# Patient Record
Sex: Male | Born: 1985 | Race: White | Hispanic: No | Marital: Single | Smoking: Never smoker
Health system: Southern US, Community
[De-identification: ages and names within clinical notes are randomized; demographics above are authoritative.]

## PROBLEM LIST (undated history)

## (undated) DIAGNOSIS — F1020 Alcohol dependence, uncomplicated: Secondary | ICD-10-CM

## (undated) DIAGNOSIS — F419 Anxiety disorder, unspecified: Secondary | ICD-10-CM

## (undated) DIAGNOSIS — F32A Depression, unspecified: Secondary | ICD-10-CM

## (undated) DIAGNOSIS — F1010 Alcohol abuse, uncomplicated: Secondary | ICD-10-CM

## (undated) HISTORY — PX: TONSILLECTOMY: SUR1361

---
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
## 2011-05-14 ENCOUNTER — Emergency Department (HOSPITAL_COMMUNITY)
Admission: EM | Admit: 2011-05-14 | Discharge: 2011-05-15 | Disposition: A | Discharge status: Other Acute Inpatient Hospital | Payer: BC Managed Care – PPO | Attending: Emergency Medicine | Admitting: Emergency Medicine

## 2011-05-14 DIAGNOSIS — F1010 Alcohol abuse, uncomplicated: Secondary | ICD-10-CM | POA: Insufficient documentation

## 2011-05-14 LAB — COMPREHENSIVE METABOLIC PANEL
Chloride: 99 mEq/L (ref 96–112)
Creatinine, Ser: 0.92 mg/dL (ref 0.50–1.35)
Glucose, Bld: 95 mg/dL (ref 70–99)
Potassium: 4.8 mEq/L (ref 3.5–5.1)

## 2011-05-14 LAB — DIFFERENTIAL
Basophils Absolute: 0.1 10*3/uL (ref 0.0–0.1)
Basophils Relative: 1 % (ref 0–1)
Eosinophils Absolute: 0.7 10*3/uL (ref 0.0–0.7)
Lymphocytes Relative: 24 % (ref 12–46)
Monocytes Relative: 15 % — ABNORMAL HIGH (ref 3–12)

## 2011-05-14 LAB — CBC
HCT: 42.9 % (ref 39.0–52.0)
Hemoglobin: 15.6 g/dL (ref 13.0–17.0)
MCV: 87 fL (ref 78.0–100.0)
Platelets: 199 10*3/uL (ref 150–400)

## 2011-05-14 LAB — HEPATIC FUNCTION PANEL
ALT: 132 U/L — ABNORMAL HIGH (ref 0–53)
Albumin: 4.4 g/dL (ref 3.5–5.2)
Indirect Bilirubin: 0.7 mg/dL (ref 0.3–0.9)
Total Protein: 7.4 g/dL (ref 6.0–8.3)

## 2011-05-14 LAB — RAPID URINE DRUG SCREEN, HOSP PERFORMED
Amphetamines: NOT DETECTED
Benzodiazepines: NOT DETECTED
Cocaine: NOT DETECTED
Opiates: NOT DETECTED
Tetrahydrocannabinol: NOT DETECTED

## 2011-05-14 LAB — ETHANOL: Alcohol, Ethyl (B): <11 mg/dL (ref 0–11)

## 2011-05-15 ENCOUNTER — Inpatient Hospital Stay (HOSPITAL_COMMUNITY)
Admission: RE | Admit: 2011-05-15 | Discharge: 2011-05-19 | DRG: 751 | Disposition: A | Discharge status: Home / Self Care | Payer: BC Managed Care – PPO | Source: Ambulatory Visit | Attending: Psychiatry | Admitting: Psychiatry

## 2011-05-15 DIAGNOSIS — F1020 Alcohol dependence, uncomplicated: Secondary | ICD-10-CM

## 2011-05-19 DIAGNOSIS — F1020 Alcohol dependence, uncomplicated: Secondary | ICD-10-CM

## 2011-05-22 NOTE — Discharge Summary (Signed)
Alexander Rowland, Alexander Rowland                 ACCOUNT NO.:  [id number]  MEDICAL RECORD NO.:  [id number]  LOCATION:  0305                          FACILITY:  [facility]  PHYSICIAN:  Orson Aloe, MD       DATE OF BIRTH:  1986/03/10  DATE OF ADMISSION:  05/15/2011 DATE OF DISCHARGE:  05/19/2011                              DISCHARGE SUMMARY   IDENTIFYING INFORMATION:  This is a 25 year old single male.  This is a voluntary admission.  HISTORY OF PRESENT ILLNESS:  First inpatient psychiatric admission and first detox for Quran, who is a 25 year old, employed full-time in the [employer].  He presented requesting detox from alcohol. Reported that his drinking had been much heavier for the previous 4 months and estimated that he was drinking one to two 12 packs of beer daily.  He reported he had been drinking since the age of 48 and had a history of 5 different legal charges over the course of several years related to his alcohol use.  Charges included such things as public intoxication and having an open container.  He says he finally came to the conclusion that his body was being ruined by alcohol after he experienced significant withdrawal symptoms when he tried to quit.  He suffered muscle aches, sweating, feeling of panic and his heart was pounding.  He requests help with his substance abuse, plans to pursue sobriety.  He denied any suicidal thoughts.  He had a history of 20 days completely sober in 2009 of his own volition, but went back to drinking later because he admitted that he liked it.  He has not had any other significant periods of sobriety since that time.  MEDICAL EVALUATION:  Physical exam was done in the emergency room.  This is a healthy, Caucasian male, normally developed with no abnormal movements.  Alcohol screen negative at admission.  Urine drug screen negative for all substances.  CBC normal.  Liver enzymes mildly elevated, SGOT 77, SGPT 142.  Normal chemistry with  normal renal function.  COURSE OF HOSPITALIZATION:  He was admitted to our detox unit and started on a Librium protocol with a goal of a safe detox in 4 days.  He presented denying any dangerous thoughts, in full contact with reality, calm, cooperative.  His insight is fair.  Group attendance and participation in group therapy was good.  He was appropriate with peers and staff here.  His detox was uneventful and he expressed an interest in [facility].  Our case manager pursued plans to have him admitted there.  He gave [place] permission to speak with his father and father was in support and offered to help him get into [facility].  He was ready for discharge by October 1st and fully detoxed with no further withdrawal symptoms.  DISCHARGE/PLAN:  Follow up at [facility] and father is pursuing an admission date.  DISCHARGE DIAGNOSES:  Axis I:  Alcohol dependence. Axis II:  Deferred. Axis III: No acute illness. Axis IV:  Severe psychosocial issues related to substance abuse and significant legal problems. Axis V:  Current 55, past year not known.  DISCHARGE MEDICATIONS:  None.  DISCHARGE CONDITION:  Stable and improved.     Margaret A. Lorin Picket, N.P.   ______________________________ Orson Aloe, MD    MAS/MEDQ  D:  05/19/2011  T:  05/19/2011  Job:  [id number]  Electronically Signed by Kari Baars N.P. on 05/20/2011 08:32:37 AM Electronically Signed by Orson Aloe  on 05/22/2011 08:16:52 AM

## 2011-05-22 NOTE — Assessment & Plan Note (Signed)
Alexander Rowland, GANGE NO.:  [id number]  MEDICAL RECORD NO.:  [id number]  LOCATION:  0305                          FACILITY:  [facility]  PHYSICIAN:  Orson Aloe, MD       DATE OF BIRTH:  02-18-1986  DATE OF ADMISSION:  05/15/2011 DATE OF DISCHARGE:                      PSYCHIATRIC ADMISSION ASSESSMENT   IDENTIFYING INFORMATION:  A 25 year old male.  This is a voluntary admission.  HISTORY OF PRESENT ILLNESS:  First inpatient psychiatric admission and first detox admission for Kasim, who is a 25 year old, employed full-time in the [employer].  He requests detox from alcohol.  He reports he has been drinking heavily for the past 4 months and estimates he is drinking one to two 12 packs of beer daily.  He has been drinking since the age of 25 and reports a history of five different legal charges over the course of several years related to his use of alcohol, such as public intoxication and having an open container.  He denies any current legal charges.  He has come to the conclusion that his body is being ruined by the alcohol after he experienced significant withdrawal symptoms when he tries to quit including muscle aches, sweating, feelings of panic and his heart pounding.  He requests detox.  Denies any other substance abuse.  PAST PSYCHIATRIC HISTORY:  No current outpatient treatment.  He denies any suicidal thoughts, although he does feel that the alcohol makes his mood more depressed, especially when he is intoxicated.  Denies any previous suicide attempts or history of dangerous behaviors.  He has been experiencing blackouts and periods of missing events.  He is getting feedback from friends and family about behaviors that he did that he has no memory of.  He denies any history of previous brain injury, coma, seizure or concussion.  He went 20 days completely sober in 2009 of his own volition, without help with medications and did quite well  during that period, but admits that he went back to drinking because he liked it.  He has not had any significant period of sobriety since that time.  He also denies use of any other substances.  SOCIAL HISTORY:  Single Caucasian male, currently employed full-time. Uses gum and cologne to hide the smell of alcohol when he is at work. Never married.  No children.  Family is supportive.  FAMILY HISTORY:  Significant for members of his mother's family with depressive disorder.  He denies a family history of substance abuse.  MEDICAL HISTORY:  No regular primary care provider.  Current medical problems are none.  Past medical history significant for tonsillectomy. No history of seizures.  No history of liver disease.  No history of medical hospitalizations.  CURRENT MEDICATIONS:  Melatonin at h.s. as needed for sleep.  DRUG ALLERGIES:  ASPIRIN.  PHYSICAL EXAM:  Done in the emergency room and is noted in the record. His alcohol screen at that time was negative.  Urine drug screen negative for all substances.  CBC normal with platelets at 199,000, normal hemoglobin.  Liver enzymes mildly elevated with SGOT 77, SGPT 142, normal chemistry, BUN 11, creatinine 0.92.  MENTAL STATUS EXAM:  Fully alert male,  pleasant cooperative, good eye contact.  Normal speech.  No delusional statements made.  Denies current or past history of any suicidal thoughts.  Cognitively he is completely intact.  Thinking is nonpsychotic.  DIAGNOSES:  Axis I:  Alcohol dependence. Axis II:  No diagnosis. Axis III:  No diagnosis. Axis IV:  Significant chronic social issues. Axis V:  Current 45, past year not known.  PLAN:  The plan is to voluntarily admit him with a goal of a safe detox. We started him on a Librium protocol with a goal of a safe detox in 4 days.  Group participation is satisfactory.  we have instructed him on how to access p.r.n. medication on the detox protocol and indications for the  same.     Margaret A. Lorin Picket, N.P.   ______________________________ Orson Aloe, MD    MAS/MEDQ  D:  05/15/2011  T:  05/15/2011  Job:  [id number]  Electronically Signed by Kari Baars N.P. on 05/15/2011 02:27:17 PM Electronically Signed by Orson Aloe  on 05/22/2011 08:16:44 AM

## 2012-07-04 ENCOUNTER — Encounter (HOSPITAL_BASED_OUTPATIENT_CLINIC_OR_DEPARTMENT_OTHER): Payer: Self-pay | Admitting: *Deleted

## 2012-07-04 ENCOUNTER — Emergency Department (HOSPITAL_BASED_OUTPATIENT_CLINIC_OR_DEPARTMENT_OTHER): Payer: BC Managed Care – PPO

## 2012-07-04 ENCOUNTER — Emergency Department (HOSPITAL_BASED_OUTPATIENT_CLINIC_OR_DEPARTMENT_OTHER)
Admission: EM | Admit: 2012-07-04 | Discharge: 2012-07-04 | Disposition: A | Discharge status: Home / Self Care | Payer: BC Managed Care – PPO | Attending: Emergency Medicine | Admitting: Emergency Medicine

## 2012-07-04 DIAGNOSIS — Y929 Unspecified place or not applicable: Secondary | ICD-10-CM | POA: Insufficient documentation

## 2012-07-04 DIAGNOSIS — F17200 Nicotine dependence, unspecified, uncomplicated: Secondary | ICD-10-CM | POA: Insufficient documentation

## 2012-07-04 DIAGNOSIS — S6990XA Unspecified injury of unspecified wrist, hand and finger(s), initial encounter: Secondary | ICD-10-CM

## 2012-07-04 DIAGNOSIS — IMO0002 Reserved for concepts with insufficient information to code with codable children: Secondary | ICD-10-CM | POA: Insufficient documentation

## 2012-07-04 DIAGNOSIS — S61409A Unspecified open wound of unspecified hand, initial encounter: Secondary | ICD-10-CM | POA: Insufficient documentation

## 2012-07-04 DIAGNOSIS — W2209XA Striking against other stationary object, initial encounter: Secondary | ICD-10-CM | POA: Insufficient documentation

## 2012-07-04 DIAGNOSIS — Y939 Activity, unspecified: Secondary | ICD-10-CM | POA: Insufficient documentation

## 2012-07-04 HISTORY — DX: Anxiety disorder, unspecified: F41.9

## 2012-07-04 NOTE — ED Notes (Addendum)
Pt states that he hit a steel grate table and injured his right hand. Lac to same. ETOH on breath. Abrasion to right elbow. Dressing applied at triage.

## 2012-07-04 NOTE — ED Provider Notes (Signed)
History     CSN: [id number]  Arrival date & time 07/04/12  2106   First MD Initiated Contact with Patient 07/04/12 2216      Chief Complaint  Patient presents with  . Hand Injury    (Consider location/radiation/quality/duration/timing/severity/associated sxs/prior treatment) HPI Comments: Patient is a 26 year old male who presents with right hand pain and wound that occurred about 1 hour ago when he hit a steel grate table. The patient reports immediate onset of moderate, throbbing pain that is localized to his right hand. He also reports a small laceration to his right hand that is not currently bleeding. Patient did not try anything for symptom relief. No aggravating/alleviaitng factors. Patient denies head trauma, headache, visual changes, chest pain, SOB, numbness/tingling distal to injury, weakness of extremity.    Past Medical History  Diagnosis Date  . Anxiety     History reviewed. No pertinent past surgical history.  No family history on file.  History  Substance Use Topics  . Smoking status: Current Every Day Smoker  . Smokeless tobacco: Not on file  . Alcohol Use: Yes      Review of Systems  Musculoskeletal: Positive for arthralgias.  Skin: Positive for wound.  All other systems reviewed and are negative.    Allergies  Aspirin  Home Medications  No current outpatient prescriptions on file.  BP 146/96  Pulse 58  Temp 98.3 F (36.8 C) (Oral)  Resp 18  Ht 5\' 11"  (1.803 m)  Wt 210 lb (95.255 kg)  BMI 29.29 kg/m2  SpO2 95%  Physical Exam  Nursing note and vitals reviewed. Constitutional: He appears well-developed and well-nourished. No distress.       Patient emits ETOH odor.   HENT:  Head: Normocephalic and atraumatic.  Eyes: Conjunctivae normal are normal.  Cardiovascular: Normal rate and regular rhythm.  Exam reveals no gallop and no friction rub.   No murmur heard. Pulmonary/Chest: Effort normal and breath sounds normal. He has no wheezes.  He has no rales. He exhibits no tenderness.  Abdominal: Soft. There is no tenderness.  Musculoskeletal: Normal range of motion.       Full ROM of all joints of right hand.   Neurological: He is alert.       Strength and sensation equal and intact bilaterally. Speech is goal-oriented. Moves limbs without ataxia.   Skin: Skin is warm and dry.       Superficial laceration to right hand located between fourth and fifth MCP. Abrasion to right elbow measuring about 2cm x 8cm. No active bleeding currently.   Psychiatric: He has a normal mood and affect. His behavior is normal.    ED Course  Procedures (including critical care time)  LACERATION REPAIR Performed by: Emilia Beck Authorized by: Emilia Beck Consent: Verbal consent obtained. Risks and benefits: risks, benefits and alternatives were discussed Consent given by: patient Patient identity confirmed: provided demographic data Prepped and Draped in normal sterile fashion Wound explored  Laceration Location: between right 4th and 5th mcp of dorsum of hand  Laceration Length: 1.0 cm  No Foreign Bodies seen or palpated  Anesthesia: none  Local anesthetic: none  Anesthetic total: 0 ml  Irrigation method: syringe Amount of cleaning: standard  Skin closure: steri strip  Number of sutures: 0  Technique: n/a  Patient tolerance: Patient tolerated the procedure well with no immediate complications.   Labs Reviewed - No data to display Dg Hand Complete Right  07/04/2012  *RADIOLOGY REPORT*  Clinical Data:  Hit hand on a steal table, pain, laceration  RIGHT HAND - COMPLETE 3+ VIEW  Comparison: None  Findings: Fingers partially superimposed on lateral view limiting assessment. Dorsal soft tissue swelling overlying the MCP joints. Osseous mineralization normal. Joint spaces preserved. No acute fracture, dislocation or bone destruction.  IMPRESSION: No acute osseous abnormalities.   Original Report Authenticated By: Ulyses Southward, M.D.      1. Hand injury       MDM  10:39 PM  Xray negative for fracture. Laceration repaired with steri strips. No further evaluation needed at this time. Patient discharged with instructions to return with worsening or concerning symptoms.        Emilia Beck, PA-C 07/07/12 1558

## 2012-07-07 NOTE — ED Provider Notes (Signed)
Medical screening examination/treatment/procedure(s) were performed by non-physician practitioner and as supervising physician I was immediately available for consultation/collaboration.   Darl Kuss, MD 07/07/12 1819 

## 2013-09-09 IMAGING — CR DG HAND COMPLETE 3+V*R*
3 series · 3 of 3 positions shown · non-contrast
Comparison: None

CLINICAL DATA: Hit hand on a steal table, pain, laceration

RIGHT HAND - COMPLETE 3+ VIEW

[x hand pa right]
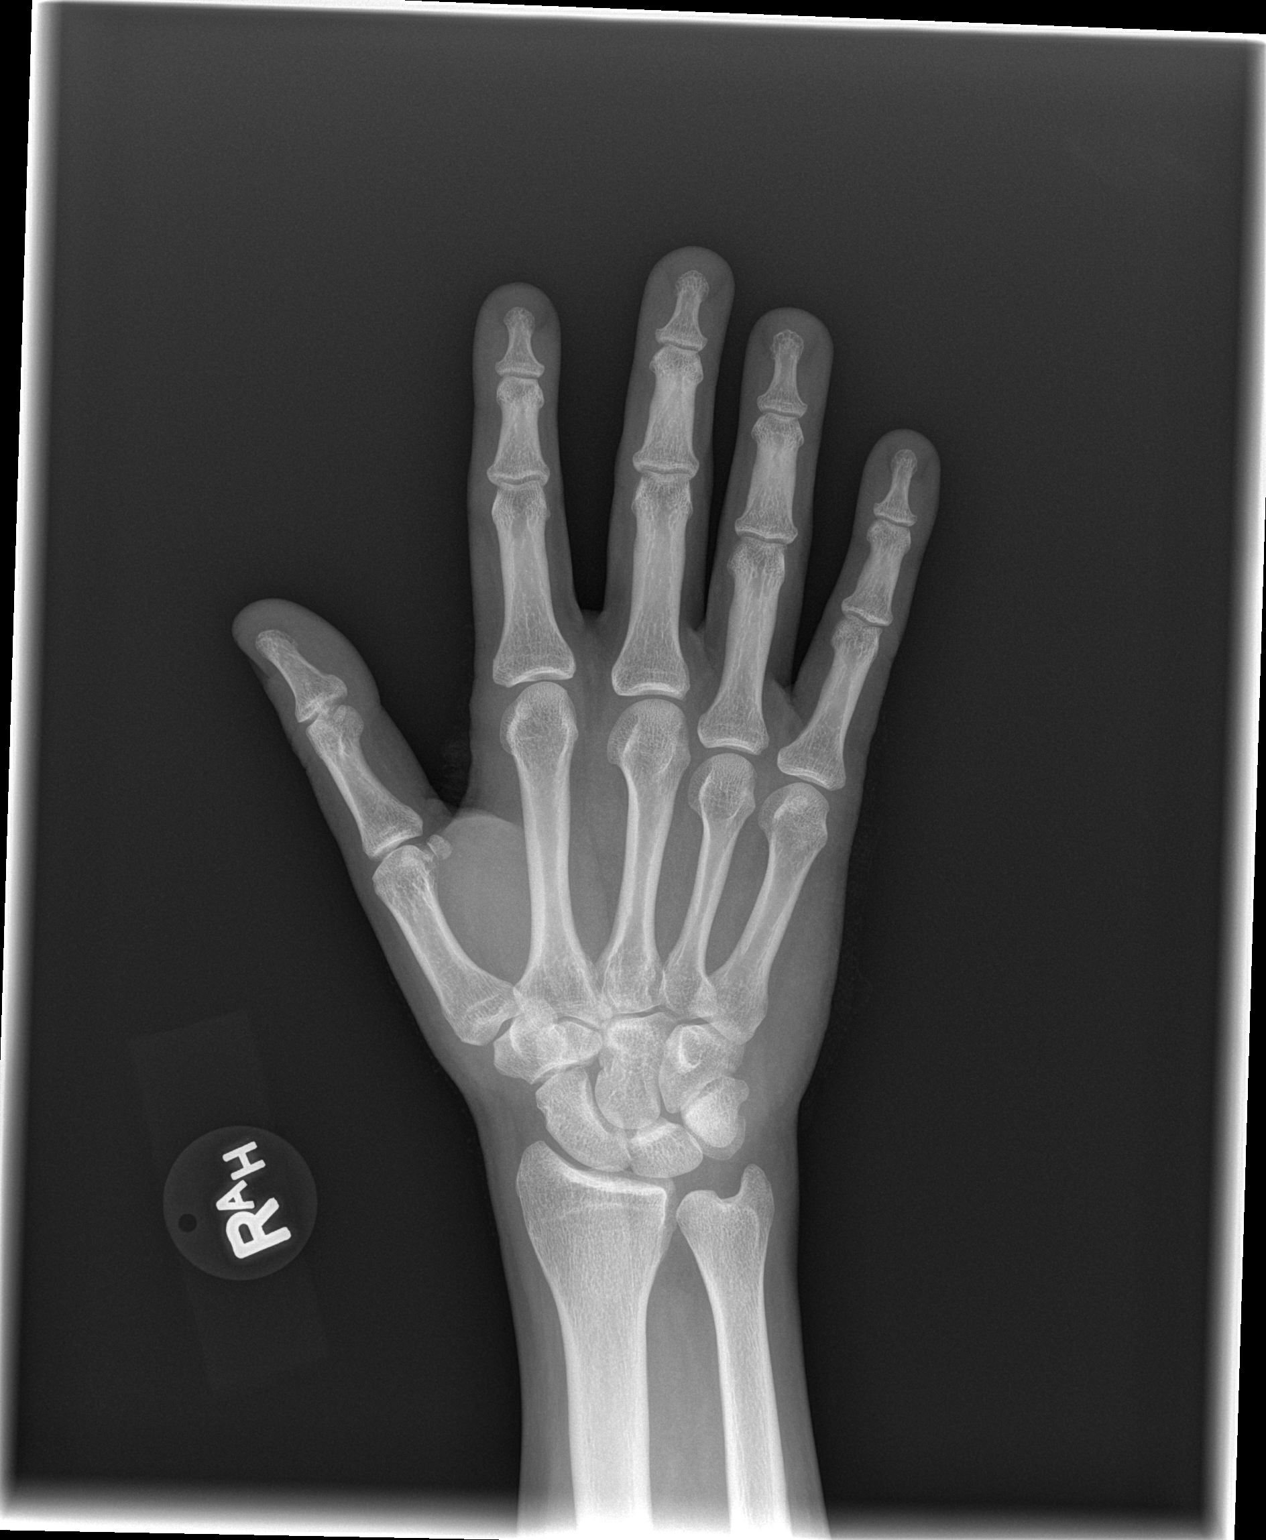

[x hand oblique right]
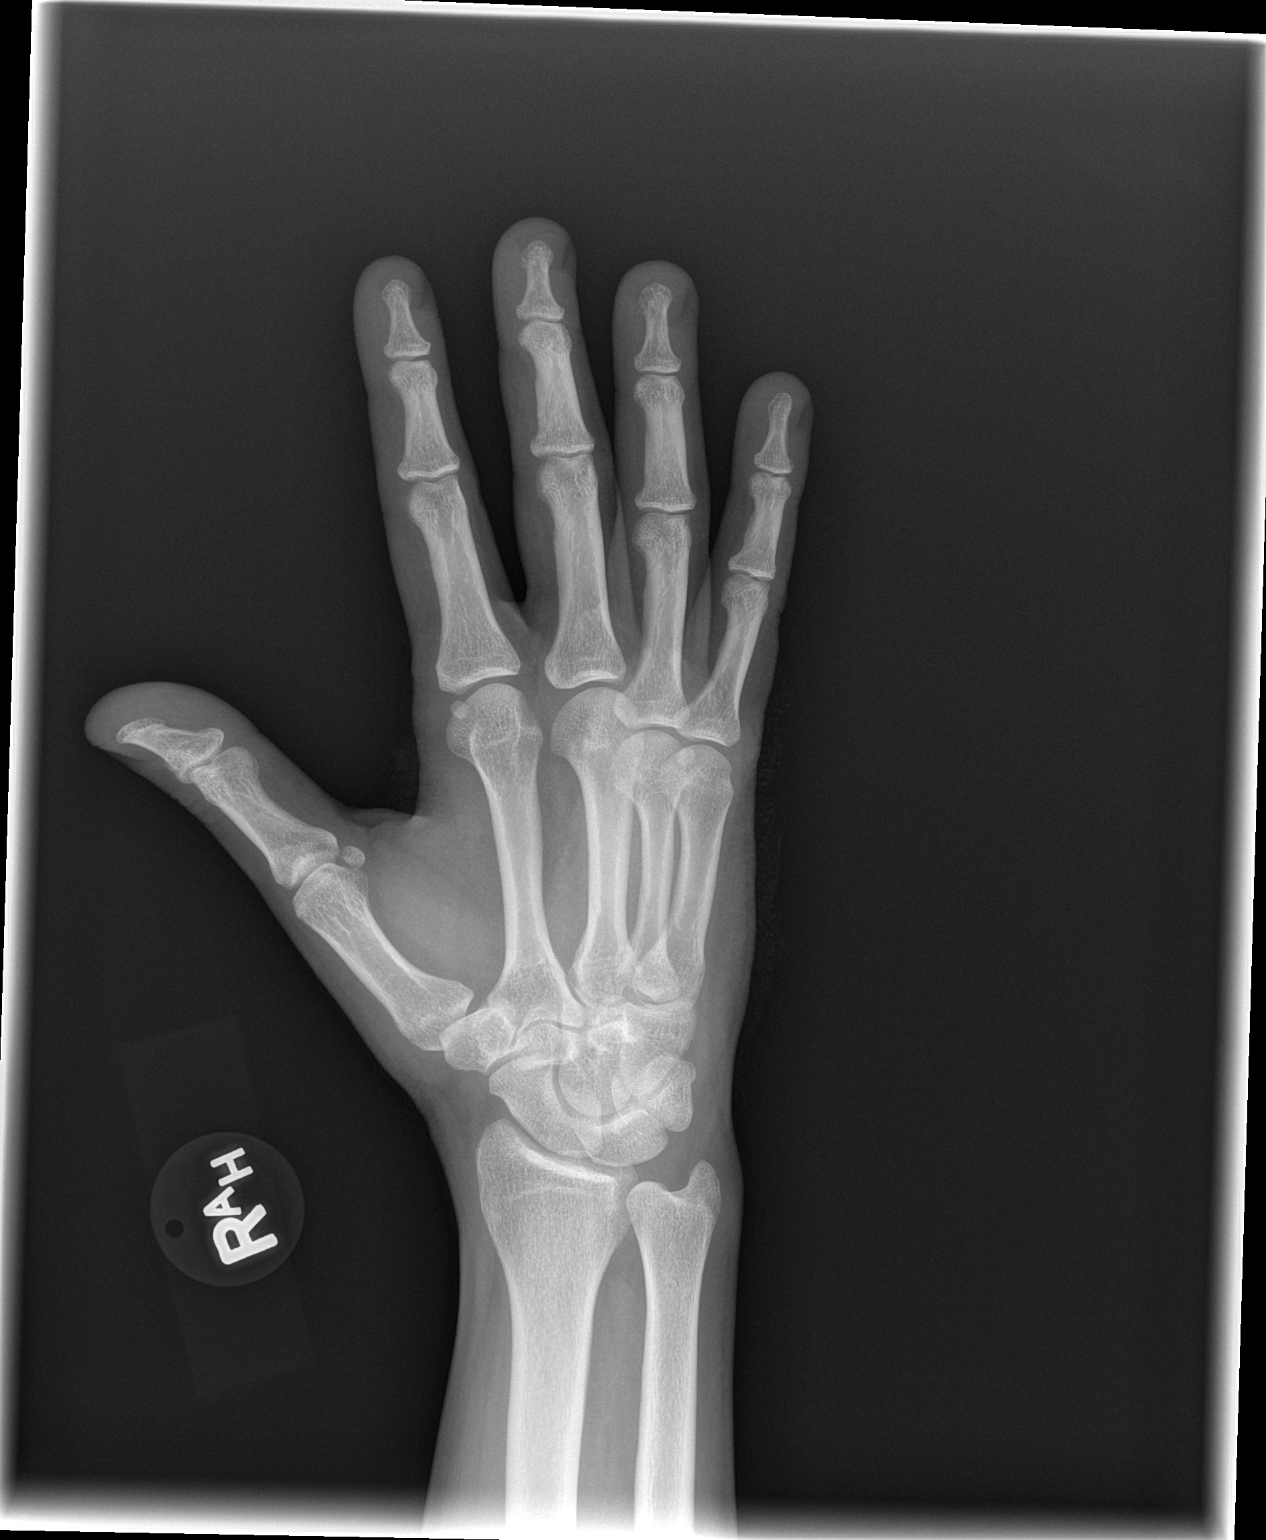

[x hand lat right]
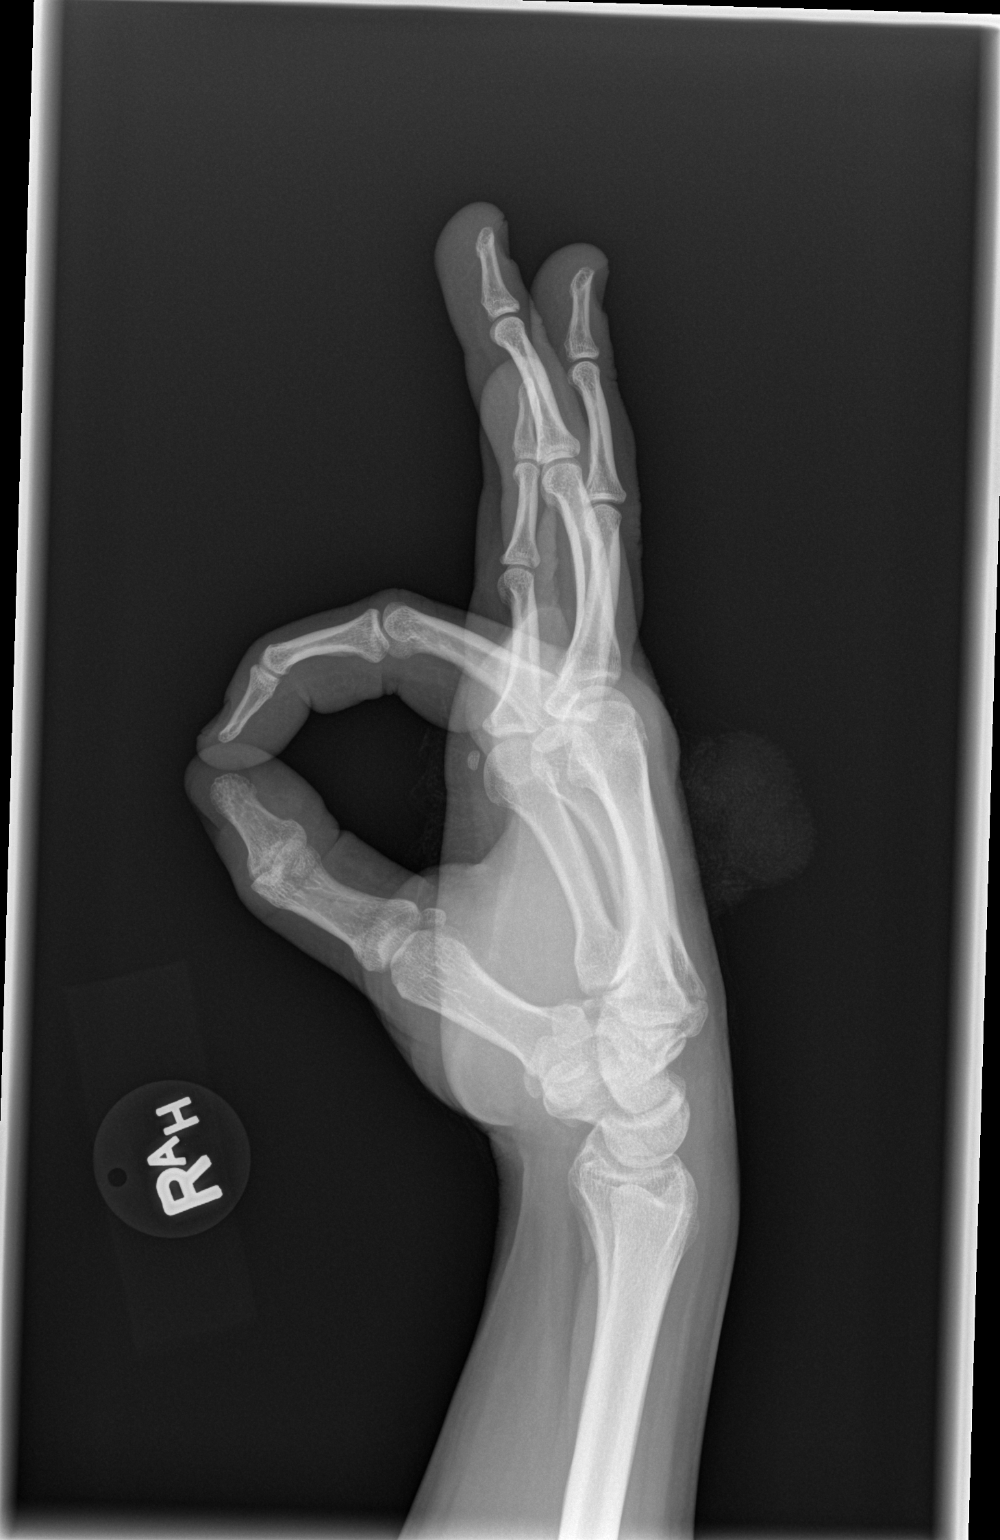

[3 of 3 positions shown; findings below may reference images not displayed]

FINDINGS: Fingers partially superimposed on lateral view limiting assessment.
Dorsal soft tissue swelling overlying the MCP joints.
Osseous mineralization normal.
Joint spaces preserved.
No acute fracture, dislocation or bone destruction.
IMPRESSION: No acute osseous abnormalities.

## 2019-04-30 ENCOUNTER — Emergency Department (HOSPITAL_BASED_OUTPATIENT_CLINIC_OR_DEPARTMENT_OTHER)
Admission: EM | Admit: 2019-04-30 | Discharge: 2019-04-30 | Disposition: A | Discharge status: Home / Self Care | Payer: 59 | Attending: Emergency Medicine | Admitting: Emergency Medicine

## 2019-04-30 ENCOUNTER — Encounter (HOSPITAL_BASED_OUTPATIENT_CLINIC_OR_DEPARTMENT_OTHER): Payer: Self-pay

## 2019-04-30 ENCOUNTER — Other Ambulatory Visit: Payer: Self-pay

## 2019-04-30 DIAGNOSIS — Z87891 Personal history of nicotine dependence: Secondary | ICD-10-CM | POA: Diagnosis not present

## 2019-04-30 DIAGNOSIS — M5431 Sciatica, right side: Secondary | ICD-10-CM

## 2019-04-30 DIAGNOSIS — M549 Dorsalgia, unspecified: Secondary | ICD-10-CM | POA: Diagnosis present

## 2019-04-30 HISTORY — DX: Alcohol abuse, uncomplicated: F10.10

## 2019-04-30 MED ORDER — PREDNISONE 20 MG PO TABS: 40.0000 mg | ORAL_TABLET | Freq: Every day | ORAL | 0 refills | Status: AC

## 2019-04-30 MED ORDER — METHOCARBAMOL 500 MG PO TABS: 500.0000 mg | ORAL_TABLET | Freq: Three times a day (TID) | ORAL | 0 refills | Status: AC | PRN

## 2019-04-30 NOTE — Discharge Instructions (Signed)

## 2019-04-30 NOTE — ED Triage Notes (Signed)
Pt c/o R sided back pain with radiation down R leg. Pt states he is an alcoholic and last had a drink 2 days ago. Pt states he is not here for alcohol treatment at this time.

## 2019-04-30 NOTE — ED Notes (Signed)
Pt reports taking 6000 mg ibuprofen at 0900 with no relief

## 2019-04-30 NOTE — ED Provider Notes (Signed)
Emergency Department Provider Note   I have reviewed the triage vital signs and the nursing notes.   HISTORY  Chief Complaint Back Pain   HPI Alexander Rowland is a 33 y.o. male with past medical history of alcohol abuse presents to the emergency department for evaluation of back pain radiating down the right leg.  He denies any numbness, weakness, fevers, injury.  No bowel or bladder incontinence.  No groin numbness.  Symptoms have been present for the past several days.  He is been taking ibuprofen with no relief in symptoms.  Patient states that he is tapering himself off of alcohol.  He drinks a significant amount of alcohol daily and has gone through rehab multiple times.  He is adamant that he does not want his alcohol use addressed during this visit.  He denies history of seizure or DTs.  Past Medical History:  Diagnosis Date  . Anxiety   . ETOH abuse     There are no active problems to display for this patient.   Past Surgical History:  Procedure Laterality Date  . TONSILLECTOMY      Allergies Aspirin  No family history on file.  Social History Social History   Tobacco Use  . Smoking status: Former Research scientist (life sciences)  . Smokeless tobacco: Never Used  Substance Use Topics  . Alcohol use: Yes  . Drug use: No    Review of Systems  Constitutional: No fever/chills Eyes: No visual changes. ENT: No sore throat. Cardiovascular: Denies chest pain. Respiratory: Denies shortness of breath. Gastrointestinal: No abdominal pain.  No nausea, no vomiting.  No diarrhea.  No constipation. Genitourinary: Negative for dysuria. Musculoskeletal: Positive back pain radiating to the right posterior thigh.  Skin: Negative for rash. Neurological: Negative for headaches, focal weakness or numbness.  10-point ROS otherwise negative.  ____________________________________________   PHYSICAL EXAM:  VITAL SIGNS: ED Triage Vitals  Enc Vitals Group     BP 04/30/19 1347 (!) 136/103   Pulse Rate 04/30/19 1347 (!) 135     Resp 04/30/19 1347 (!) 32     Temp 04/30/19 1347 99.1 F (37.3 C)     Temp Source 04/30/19 1347 Oral     SpO2 04/30/19 1347 97 %     Weight 04/30/19 1352 220 lb (99.8 kg)     Height 04/30/19 1352 5\' 11"  (1.803 m)   Constitutional: Alert and oriented. Well appearing and in no acute distress. Eyes: Conjunctivae are normal. Head: Atraumatic. Nose: No congestion/rhinnorhea. Mouth/Throat: Mucous membranes are moist.  Neck: No stridor.  Cardiovascular: Normal rate, regular rhythm. Good peripheral circulation. Grossly normal heart sounds.   Respiratory: Normal respiratory effort.  No retractions. Lungs CTAB. Gastrointestinal: Soft and nontender. No distention.  Musculoskeletal: No lower extremity tenderness nor edema. No gross deformities of extremities. No midline thoracic or lumbar spine tenderness. Tenderness to palpation over the right superior gluteal region.  Neurologic:  Normal speech and language. No gross focal neurologic deficits are appreciated. No tremor.  Skin:  Skin is warm, dry and intact. No rash noted.  ____________________________________________  RADIOLOGY  None  ____________________________________________   PROCEDURES  Procedure(s) performed:   Procedures  None  ____________________________________________   INITIAL IMPRESSION / ASSESSMENT AND PLAN / ED COURSE  Pertinent labs & imaging results that were available during my care of the patient were reviewed by me and considered in my medical decision making (see chart for details).   Patient's ED evaluation most consistent with sciatica.  He has no midline spine tenderness  to necessitate imaging.  No neuro deficits.  No concern for acute spine emergency.  I did discuss his alcohol use.  He does not wish for local resources, Librium taper, or other assistance which were all offered.  I discussed that if his withdrawal symptoms become more severe he should return to the  closest emergency department as alcohol withdrawal can be serious and even life-threatening.  Patient verbalized understanding.  Will give Robaxin and steroid burst for his back discomfort.  He will continue NSAIDs.   ____________________________________________  FINAL CLINICAL IMPRESSION(S) / ED DIAGNOSES  Final diagnoses:  Sciatica of right side     NEW OUTPATIENT MEDICATIONS STARTED DURING THIS VISIT:  Discharge Medication List as of 04/30/2019  3:09 PM    START taking these medications   Details  methocarbamol (ROBAXIN) 500 MG tablet Take 1 tablet (500 mg total) by mouth every 8 (eight) hours as needed for muscle spasms., Starting Sat 04/30/2019, Print    predniSONE (DELTASONE) 20 MG tablet Take 2 tablets (40 mg total) by mouth daily for 5 days., Starting Sat 04/30/2019, Until Thu 05/05/2019, Print        Note:  This document was prepared using Dragon voice recognition software and may include unintentional dictation errors.  Alona BeneJoshua Kenniyah Sasaki, MD Emergency Medicine    Prisilla Kocsis, Arlyss RepressJoshua G, MD 04/30/19 [phone]

## 2019-04-30 NOTE — ED Notes (Signed)
Pt reports he is not presenting to ED regarding alcohol use, only wants his back and leg pain addressed. Pt informed after requested that he is free to leave on his own accord.

## 2021-11-26 ENCOUNTER — Other Ambulatory Visit: Payer: Self-pay

## 2021-11-26 ENCOUNTER — Encounter (HOSPITAL_COMMUNITY): Payer: Self-pay

## 2021-11-26 ENCOUNTER — Emergency Department (HOSPITAL_COMMUNITY)
Admission: EM | Admit: 2021-11-26 | Discharge: 2021-11-27 | Disposition: A | Discharge status: Other Acute Inpatient Hospital | Payer: Self-pay | Attending: Emergency Medicine | Admitting: Emergency Medicine

## 2021-11-26 DIAGNOSIS — X838XXA Intentional self-harm by other specified means, initial encounter: Secondary | ICD-10-CM | POA: Insufficient documentation

## 2021-11-26 DIAGNOSIS — T1491XA Suicide attempt, initial encounter: Secondary | ICD-10-CM | POA: Insufficient documentation

## 2021-11-26 DIAGNOSIS — Y908 Blood alcohol level of 240 mg/100 ml or more: Secondary | ICD-10-CM | POA: Insufficient documentation

## 2021-11-26 DIAGNOSIS — T50902A Poisoning by unspecified drugs, medicaments and biological substances, intentional self-harm, initial encounter: Secondary | ICD-10-CM

## 2021-11-26 DIAGNOSIS — F322 Major depressive disorder, single episode, severe without psychotic features: Secondary | ICD-10-CM | POA: Diagnosis present

## 2021-11-26 DIAGNOSIS — Z20822 Contact with and (suspected) exposure to covid-19: Secondary | ICD-10-CM | POA: Insufficient documentation

## 2021-11-26 DIAGNOSIS — R7309 Other abnormal glucose: Secondary | ICD-10-CM | POA: Insufficient documentation

## 2021-11-26 DIAGNOSIS — T43292A Poisoning by other antidepressants, intentional self-harm, initial encounter: Secondary | ICD-10-CM | POA: Insufficient documentation

## 2021-11-26 DIAGNOSIS — R7401 Elevation of levels of liver transaminase levels: Secondary | ICD-10-CM | POA: Insufficient documentation

## 2021-11-26 DIAGNOSIS — F10220 Alcohol dependence with intoxication, uncomplicated: Secondary | ICD-10-CM | POA: Insufficient documentation

## 2021-11-26 DIAGNOSIS — F323 Major depressive disorder, single episode, severe with psychotic features: Secondary | ICD-10-CM | POA: Insufficient documentation

## 2021-11-26 HISTORY — DX: Alcohol dependence, uncomplicated: F10.20

## 2021-11-26 HISTORY — DX: Depression, unspecified: F32.A

## 2021-11-26 LAB — CBC
HCT: 46.2 % (ref 39.0–52.0)
Hemoglobin: 15.7 g/dL (ref 13.0–17.0)
MCH: 32.2 pg (ref 26.0–34.0)
MCHC: 34 g/dL (ref 30.0–36.0)
MCV: 94.7 fL (ref 80.0–100.0)
Platelets: 254 10*3/uL (ref 150–400)
RBC: 4.88 MIL/uL (ref 4.22–5.81)
RDW: 11.9 % (ref 11.5–15.5)
WBC: 7.3 10*3/uL (ref 4.0–10.5)
nRBC: 0 % (ref 0.0–0.2)

## 2021-11-26 LAB — RESP PANEL BY RT-PCR (FLU A&B, COVID) ARPGX2
Influenza A by PCR: NEGATIVE
Influenza B by PCR: NEGATIVE
SARS Coronavirus 2 by RT PCR: NEGATIVE

## 2021-11-26 LAB — RAPID URINE DRUG SCREEN, HOSP PERFORMED
Amphetamines: NOT DETECTED
Barbiturates: NOT DETECTED
Benzodiazepines: POSITIVE — AB
Cocaine: NOT DETECTED
Opiates: NOT DETECTED
Tetrahydrocannabinol: POSITIVE — AB

## 2021-11-26 LAB — COMPREHENSIVE METABOLIC PANEL
ALT: 142 U/L — ABNORMAL HIGH (ref 0–44)
AST: 119 U/L — ABNORMAL HIGH (ref 15–41)
Albumin: 4.7 g/dL (ref 3.5–5.0)
Alkaline Phosphatase: 65 U/L (ref 38–126)
Anion gap: 6 (ref 5–15)
BUN: 6 mg/dL (ref 6–20)
CO2: 29 mmol/L (ref 22–32)
Calcium: 9.3 mg/dL (ref 8.9–10.3)
Chloride: 106 mmol/L (ref 98–111)
Creatinine, Ser: 0.91 mg/dL (ref 0.61–1.24)
GFR, Estimated: >60 mL/min (ref >60)
Glucose, Bld: 104 mg/dL — ABNORMAL HIGH (ref 70–99)
Potassium: 3.4 mmol/L — ABNORMAL LOW (ref 3.5–5.1)
Sodium: 141 mmol/L (ref 135–145)
Total Bilirubin: 0.4 mg/dL (ref 0.3–1.2)
Total Protein: 8.3 g/dL — ABNORMAL HIGH (ref 6.5–8.1)

## 2021-11-26 LAB — ETHANOL: Alcohol, Ethyl (B): 287 mg/dL — ABNORMAL HIGH (ref <10)

## 2021-11-26 LAB — CBG MONITORING, ED: Glucose-Capillary: 106 mg/dL — ABNORMAL HIGH (ref 70–99)

## 2021-11-26 LAB — SALICYLATE LEVEL: Salicylate Lvl: <7 mg/dL — ABNORMAL LOW (ref 7.0–30.0)

## 2021-11-26 LAB — ACETAMINOPHEN LEVEL
Acetaminophen (Tylenol), Serum: <10 ug/mL — ABNORMAL LOW (ref 10–30)
Acetaminophen (Tylenol), Serum: <10 ug/mL — ABNORMAL LOW (ref 10–30)

## 2021-11-26 MED ORDER — ONDANSETRON HCL 4 MG PO TABS: 4.0000 mg | ORAL_TABLET | Freq: Three times a day (TID) | ORAL | Status: DC | PRN

## 2021-11-26 MED ORDER — IBUPROFEN 200 MG PO TABS: 600.0000 mg | ORAL_TABLET | Freq: Three times a day (TID) | ORAL | Status: DC | PRN

## 2021-11-26 MED ORDER — THIAMINE HCL 100 MG/ML IJ SOLN
100.0000 mg | Freq: Once | INTRAMUSCULAR | Status: AC
  Administered 2021-11-26: 100 mg via INTRAVENOUS
  Filled 2021-11-26: qty 2

## 2021-11-26 MED ORDER — LORAZEPAM 2 MG/ML IJ SOLN
0.0000 mg | Freq: Four times a day (QID) | INTRAMUSCULAR | Status: DC
  Administered 2021-11-26: 2 mg via INTRAVENOUS
  Administered 2021-11-26: 1 mg via INTRAVENOUS
  Filled 2021-11-26 (×2): qty 1

## 2021-11-26 MED ORDER — NICOTINE 21 MG/24HR TD PT24
21.0000 mg | MEDICATED_PATCH | Freq: Every day | TRANSDERMAL | Status: DC
  Filled 2021-11-26: qty 1

## 2021-11-26 MED ORDER — SODIUM CHLORIDE 0.9 % IV BOLUS
1000.0000 mL | Freq: Once | INTRAVENOUS | Status: AC
  Administered 2021-11-26: 1000 mL via INTRAVENOUS

## 2021-11-26 MED ORDER — ALUM & MAG HYDROXIDE-SIMETH 200-200-20 MG/5ML PO SUSP: 30.0000 mL | Freq: Four times a day (QID) | ORAL | Status: DC | PRN

## 2021-11-26 MED ORDER — PANTOPRAZOLE SODIUM 40 MG IV SOLR
40.0000 mg | Freq: Once | INTRAVENOUS | Status: AC
  Administered 2021-11-26: 40 mg via INTRAVENOUS
  Filled 2021-11-26: qty 10

## 2021-11-26 MED ORDER — ONDANSETRON HCL 4 MG/2ML IJ SOLN
4.0000 mg | Freq: Once | INTRAMUSCULAR | Status: AC
  Administered 2021-11-26: 4 mg via INTRAVENOUS
  Filled 2021-11-26: qty 2

## 2021-11-26 MED ORDER — THIAMINE HCL 100 MG PO TABS
100.0000 mg | ORAL_TABLET | Freq: Every day | ORAL | Status: DC
  Administered 2021-11-26 – 2021-11-27 (×2): 100 mg via ORAL
  Filled 2021-11-26 (×2): qty 1

## 2021-11-26 MED ORDER — LORAZEPAM 1 MG PO TABS
0.0000 mg | ORAL_TABLET | Freq: Four times a day (QID) | ORAL | Status: DC
  Administered 2021-11-26: 2 mg via ORAL
  Filled 2021-11-26: qty 2

## 2021-11-26 MED ORDER — THIAMINE HCL 100 MG/ML IJ SOLN
100.0000 mg | Freq: Every day | INTRAMUSCULAR | Status: DC
  Filled 2021-11-26: qty 2

## 2021-11-26 NOTE — ED Notes (Signed)
Pt advised of his dinner tray, pt sts he is still not hungry and only wants water. Pt given water ?

## 2021-11-26 NOTE — ED Notes (Signed)
Per poison control repeat Tylenol level, medically clear if Tylenol level is negative.  ?

## 2021-11-26 NOTE — ED Triage Notes (Addendum)
Pt took a whole bottle of Fluoxetine 20 mg capsules @ 0100 because he states that he wants to kill himself. Pt states that he is an alcoholic and that he was drinking tonight. (Vodka)  ?

## 2021-11-26 NOTE — ED Provider Notes (Signed)
? ?[facility] ?Provider Note: Lowella Dell, MD, FACEP ? ?CSN: [id number] ?MRN: [id number] ?ARRIVAL: 11/26/21 at 0226 ?ROOM: RESA/RESA ? ? ?CHIEF COMPLAINT  ?Drug Overdose ? ? ?HISTORY OF PRESENT ILLNESS  ?11/26/21 3:16 AM ?Alexander Rowland is a 36 y.o. male with a history of alcoholism.  He took 20, 20 mg fluoxetine tablets at 1 AM and what he stated was a suicide attempt.  He tells me he now realizes that a fluoxetine overdose would not be fatal.  He denies any coingestants other than "1/5 of vodka".  Patient admits to being an alcoholic and states it runs in his family. ? ? ?Past Medical History:  ?Diagnosis Date  ? Alcoholism (HCC)   ? Depression   ? ? ?History reviewed. No pertinent surgical history. ? ?Family History  ?Problem Relation Age of Onset  ? Alcoholism Father   ? ? ?Social History  ? ?Vaping Use  ? Vaping Use: Some days  ?Substance Use Topics  ? Alcohol use: Yes  ? ? ?Prior to Admission medications   ?Not on File  ? ? ?Allergies ?Patient has no allergy information on record. ? ? ?REVIEW OF SYSTEMS  ?Negative except as noted here or in the History of Present Illness. ? ? ?PHYSICAL EXAMINATION  ?Initial Vital Signs ?Blood pressure 112/79, pulse (!) 102, temperature (!) 97.2 ?F (36.2 ?C), temperature source Oral, resp. rate 18, height 5\' 11"  (1.803 m), weight 108.9 kg, SpO2 100 %. ? ?Examination ?General: Well-developed, well-nourished male in no acute distress; appearance consistent with age of record ?HENT: normocephalic; atraumatic ?Eyes: pupils equal, round and reactive to light; extraocular muscles intact ?Neck: supple ?Heart: regular rate and rhythm ?Lungs: clear to auscultation bilaterally ?Abdomen: soft; nondistended; nontender; bowel sounds present ?Extremities: No deformity; full range of motion; pulses normal ?Neurologic: Awake, alert and oriented; dysarthria; ataxia; motor function intact in all extremities and symmetric; no facial droop ?Skin: Warm and dry ?Psychiatric: Depressed  mood with congruent affect ? ? ?RESULTS  ?Summary of this visit's results, reviewed and interpreted by myself: ? ? EKG Interpretation ? ?Date/Time:  Tuesday November 26 2021 02:59:45 EDT ?Ventricular Rate:  104 ?PR Interval:  147 ?QRS Duration: 103 ?QT Interval:  344 ?QTC Calculation: 453 ?R Axis:   30 ?Text Interpretation: Sinus tachycardia Low voltage, precordial leads No previous ECGs available Confirmed by Elizabella Nolet (03-28-2004) on 11/26/2021 3:35:07 AM ?  ? ?  ? ?Laboratory Studies: ?Results for orders placed or performed during the hospital encounter of 11/26/21 (from the past 24 hour(s))  ?Comprehensive metabolic panel     Status: Abnormal  ? Collection Time: 11/26/21  2:37 AM  ?Result Value Ref Range  ? Sodium 141 135 - 145 mmol/L  ? Potassium 3.4 (L) 3.5 - 5.1 mmol/L  ? Chloride 106 98 - 111 mmol/L  ? CO2 29 22 - 32 mmol/L  ? Glucose, Bld 104 (H) 70 - 99 mg/dL  ? BUN 6 6 - 20 mg/dL  ? Creatinine, Ser 0.91 0.61 - 1.24 mg/dL  ? Calcium 9.3 8.9 - 10.3 mg/dL  ? Total Protein 8.3 (H) 6.5 - 8.1 g/dL  ? Albumin 4.7 3.5 - 5.0 g/dL  ? AST 119 (H) 15 - 41 U/L  ? ALT 142 (H) 0 - 44 U/L  ? Alkaline Phosphatase 65 38 - 126 U/L  ? Total Bilirubin 0.4 0.3 - 1.2 mg/dL  ? GFR, Estimated >60 >60 mL/min  ? Anion gap 6 5 - 15  ?Ethanol  Status: Abnormal  ? Collection Time: 11/26/21  2:37 AM  ?Result Value Ref Range  ? Alcohol, Ethyl (B) 287 (H) <10 mg/dL  ?Salicylate level     Status: Abnormal  ? Collection Time: 11/26/21  2:37 AM  ?Result Value Ref Range  ? Salicylate Lvl <7.0 (L) 7.0 - 30.0 mg/dL  ?Acetaminophen level     Status: Abnormal  ? Collection Time: 11/26/21  2:37 AM  ?Result Value Ref Range  ? Acetaminophen (Tylenol), Serum <10 (L) 10 - 30 ug/mL  ?cbc     Status: None  ? Collection Time: 11/26/21  2:37 AM  ?Result Value Ref Range  ? WBC 7.3 4.0 - 10.5 K/uL  ? RBC 4.88 4.22 - 5.81 MIL/uL  ? Hemoglobin 15.7 13.0 - 17.0 g/dL  ? HCT 46.2 39.0 - 52.0 %  ? MCV 94.7 80.0 - 100.0 fL  ? MCH 32.2 26.0 - 34.0 pg  ? MCHC 34.0  30.0 - 36.0 g/dL  ? RDW 11.9 11.5 - 15.5 %  ? Platelets 254 150 - 400 K/uL  ? nRBC 0.0 0.0 - 0.2 %  ?CBG monitoring, ED     Status: Abnormal  ? Collection Time: 11/26/21  2:41 AM  ?Result Value Ref Range  ? Glucose-Capillary 106 (H) 70 - 99 mg/dL  ? ?Imaging Studies: ?No results found. ? ?ED COURSE and MDM  ?Nursing notes, initial and subsequent vitals signs, including pulse oximetry, reviewed and interpreted by myself. ? ?Vitals:  ? 11/26/21 0237 11/26/21 0300 11/26/21 0400 11/26/21 0430  ?BP: 118/85 112/79 104/65 116/78  ?Pulse: (!) 107 (!) 102 98 (!) 103  ?Resp: (!) 22 18 17 20   ?Temp: (!) 97.2 ?F (36.2 ?C)     ?TempSrc: Oral     ?SpO2: 94% 100% 95% 93%  ?Weight:      ?Height:      ? ?Medications  ?ondansetron (ZOFRAN) injection 4 mg (4 mg Intravenous Given 11/26/21 0400)  ?pantoprazole (PROTONIX) injection 40 mg (40 mg Intravenous Given 11/26/21 0359)  ?sodium chloride 0.9 % bolus 1,000 mL (1,000 mLs Intravenous New Bag/Given 11/26/21 0402)  ?thiamine (B-1) injection 100 mg (100 mg Intravenous Given 11/26/21 0401)  ? ?4:24 AM ?Patient involuntarily committed by myself.  We will have him assessed by TTS. ? ?5:41 AM ?Patient has been stable since arrival.  He has been able to ambulate to the bathroom.  Awaiting TTS assessment. ? ? ?PROCEDURES  ?Procedures ? ? ?ED DIAGNOSES  ? ?  ICD-10-CM   ?1. Intentional drug overdose, initial encounter [facility])  T50.902A   ?  ?2. Suicide attempt [facility])  T14.91XA   ?  ?3. Alcohol intoxication in active alcoholic without complication (HCC)  F10.220   ?  ?4. Elevated liver transaminase level  R74.01   ?  ? ? ? ?  ?[facility], MD ?11/26/21 0542 ? ?

## 2021-11-26 NOTE — ED Notes (Signed)
Per poison control recommendation, repeat ekg done.  ?

## 2021-11-26 NOTE — Progress Notes (Signed)
Patient has been faxed out due to no appropriate beds available at [facility]. Patient meets BH inpatient criteria per Dahlia Byes, NP. Patient has been faxed out to the following facilities:  ? ?[facility]  [address] [place] [place] [address] [phone]  ?[facility]  [address] [place] [address] [phone]  ?[facility]  [address] [place] [address] [phone]  ?[facility]  [address], [place] [address] [phone]  ?[facility]  [address] [place], [place] [address] [phone]  ?[facility]  [address] [place], [place] [address] [phone]  ?[facility]  [address] [place] [address] [phone]  ?[facility]  [address] [place] [address] [phone]  ?[facility]  [address] [place] [address] [phone]  ?[facility]  [address] [place] [address] [phone]  ?  ?Damita Dunnings, MSW, LCSW-A  ?7:47 PM 11/26/2021   ?

## 2021-11-26 NOTE — ED Notes (Signed)
Informed pt of need for urine.   Pt stated that he would try to give a specimen. ?

## 2021-11-26 NOTE — ED Notes (Signed)
Per Julieanne Cotton NP, Pt will be kept overnight for observation.  Also, Pt reports he has a bed at [facility] for another 28 day rehab stay.  ?

## 2021-11-26 NOTE — ED Notes (Signed)
Put pt's phone in belongings bag ? ?

## 2021-11-26 NOTE — ED Notes (Addendum)
Pt changed into burgundy scrubs. Patient belonging bag placed in cabinet above 19-22 nurses station. Belongings include one shirt, one pair of pants with black belt, pair of sneakers, pair of socks, and one bracelet.  ?

## 2021-11-26 NOTE — ED Notes (Signed)
After Julieanne Cotton NP reviewed case w/ Lucianne Muss MD, Pt requires inpatient admission.  Pt currently agrees to voluntary admission.  If he tried to leave, psych will IVC.  ?

## 2021-11-26 NOTE — ED Notes (Signed)
Josephine NP at bedside.  ?

## 2021-11-26 NOTE — Consult Note (Signed)
[facility] Face-to-Face Psychiatry Consult  ? ?Reason for Consult:  Psychiatric Evaluation-Suicide attempt ?Referring Physician:  ER Physician ?Patient Identification: Alexander Rowland ?MRN:  [id number] ?Principal Diagnosis: Suicide attempt [facility]) ?Diagnosis:  Principal Problem: ?  Suicide attempt (HCC) ?Active Problems: ?  Depression, major, single episode, severe (HCC) ? ? ?Total Time spent with patient: 45 minutes ? ?Subjective:   ?Alexander Rowland is a 36 y.o. male patient admitted with suicide attempt by ingesting 20 Capsules of 20 mg Prozac while Alcohol was on board his system.  This OD was in an attempt to end his life he says.. ? ?HPI:  Patient has a long hx of Alcoholism and has been to various rehab treatment center and recently came out after detox at [facility] recently but relapsed after few days.  Patient reported that he took the Prozac Capsules 1 am last night and wanted to end his life.  He reported that he love drinking and it has cost him his job.  He currently lives with his parents and the Prozac was given to him as part of his Detox because he told the staff that he comes from a family of Depression.  Patient denied previous diagnosis of any Mental illness in the past.  Patient admitted to drinking 12-24 bottles of Alcohol a day.  He denied  SI/HI/AVH.  He is accepted for admission for treatment of depression and stabilization and safety.  Of note, Alcohol level was 287 on arrival to ER, UDS positive for Marijuana and Benzodiazepine and LFT are elevated. ? ?Past Psychiatric History: Alcoholism ?Risk to Self:   ?Risk to Others:   ?Prior Inpatient Therapy:   ?Prior Outpatient Therapy:   ? ?Past Medical History:  ?Past Medical History:  ?Diagnosis Date  ? Alcoholism (HCC)   ? Depression   ? History reviewed. No pertinent surgical history. ?Family History:  ?Family History  ?Problem Relation Age of Onset  ? Alcoholism Father   ? ?Family Psychiatric  History: Mother, grandfather and uncle from  mother's side of the family -Depression. ?Social History:  ?Social History  ? ?Substance and Sexual Activity  ?Alcohol Use Yes  ?   ?Social History  ? ?Substance and Sexual Activity  ?Drug Use Not on file  ?  ?Social History  ? ?Socioeconomic History  ? Marital status: Single  ?  Spouse name: Not on file  ? Number of children: Not on file  ? Years of education: Not on file  ? Highest education level: Not on file  ?Occupational History  ? Not on file  ?Tobacco Use  ? Smoking status: Not on file  ? Smokeless tobacco: Not on file  ?Vaping Use  ? Vaping Use: Some days  ?Substance and Sexual Activity  ? Alcohol use: Yes  ? Drug use: Not on file  ? Sexual activity: Not on file  ?Other Topics Concern  ? Not on file  ?Social History Narrative  ? Not on file  ? ?Social Determinants of Health  ? ?Financial Resource Strain: Not on file  ?Food Insecurity: Not on file  ?Transportation Needs: Not on file  ?Physical Activity: Not on file  ?Stress: Not on file  ?Social Connections: Not on file  ? ?Additional Social History: ?  ? ?Allergies:  Not on File ? ?Labs:  ?Results for orders placed or performed during the hospital encounter of 11/26/21 (from the past 48 hour(s))  ?Comprehensive metabolic panel     Status: Abnormal  ? Collection Time: 11/26/21  2:37  AM  ?Result Value Ref Range  ? Sodium 141 135 - 145 mmol/L  ? Potassium 3.4 (L) 3.5 - 5.1 mmol/L  ? Chloride 106 98 - 111 mmol/L  ? CO2 29 22 - 32 mmol/L  ? Glucose, Bld 104 (H) 70 - 99 mg/dL  ?  Comment: Glucose reference range applies only to samples taken after fasting for at least 8 hours.  ? BUN 6 6 - 20 mg/dL  ? Creatinine, Ser 0.91 0.61 - 1.24 mg/dL  ? Calcium 9.3 8.9 - 10.3 mg/dL  ? Total Protein 8.3 (H) 6.5 - 8.1 g/dL  ? Albumin 4.7 3.5 - 5.0 g/dL  ? AST 119 (H) 15 - 41 U/L  ? ALT 142 (H) 0 - 44 U/L  ? Alkaline Phosphatase 65 38 - 126 U/L  ? Total Bilirubin 0.4 0.3 - 1.2 mg/dL  ? GFR, Estimated >60 >60 mL/min  ?  Comment: (NOTE) ?Calculated using the CKD-EPI Creatinine  Equation (2021) ?  ? Anion gap 6 5 - 15  ?  Comment: Performed at [facility], [address] [place], [place] [address]  ?Ethanol     Status: Abnormal  ? Collection Time: 11/26/21  2:37 AM  ?Result Value Ref Range  ? Alcohol, Ethyl (B) 287 (H) <10 mg/dL  ?  Comment: (NOTE) ?Lowest detectable limit for serum alcohol is 10 mg/dL. ? ?For medical purposes only. ?Performed at [facility], [address] ?[place], [place] [address] ?  ?Salicylate level     Status: Abnormal  ? Collection Time: 11/26/21  2:37 AM  ?Result Value Ref Range  ? Salicylate Lvl <7.0 (L) 7.0 - 30.0 mg/dL  ?  Comment: Performed at [facility], [address] [place], [place] [address]  ?Acetaminophen level     Status: Abnormal  ? Collection Time: 11/26/21  2:37 AM  ?Result Value Ref Range  ? Acetaminophen (Tylenol), Serum <10 (L) 10 - 30 ug/mL  ?  Comment: (NOTE) ?Therapeutic concentrations vary significantly. A range of 10-30 ug/mL  ?may be an effective concentration for many patients. However, some  ?are best treated at concentrations outside of this range. ?Acetaminophen concentrations >150 ug/mL at 4 hours after ingestion  ?and >50 ug/mL at 12 hours after ingestion are often associated with  ?toxic reactions. ? ?Performed at [facility], [address] ?[place], [place] [address] ?  ?cbc     Status: None  ? Collection Time: 11/26/21  2:37 AM  ?Result Value Ref Range  ? WBC 7.3 4.0 - 10.5 K/uL  ? RBC 4.88 4.22 - 5.81 MIL/uL  ? Hemoglobin 15.7 13.0 - 17.0 g/dL  ? HCT 46.2 39.0 - 52.0 %  ? MCV 94.7 80.0 - 100.0 fL  ? MCH 32.2 26.0 - 34.0 pg  ? MCHC 34.0 30.0 - 36.0 g/dL  ? RDW 11.9 11.5 - 15.5 %  ? Platelets 254 150 - 400 K/uL  ? nRBC 0.0 0.0 - 0.2 %  ?  Comment: Performed at [facility], [address], [place], [place] [address]  ?CBG monitoring, ED     Status: Abnormal  ? Collection Time: 11/26/21  2:41 AM  ?Result Value Ref Range  ?  Glucose-Capillary 106 (H) 70 - 99 mg/dL  ?  Comment: Glucose reference range applies only to samples taken after fasting for at least 8 hours.  ?Resp Panel by RT-PCR (Flu A&B, Covid) Nasopharyngeal Swab     Status: None  ?  Collection Time: 11/26/21  6:04 AM  ? Specimen: Nasopharyngeal Swab; Nasopharyngeal(NP) swabs in vial transport medium  ?Result Value Ref Range  ? SARS Coronavirus 2 by RT PCR NEGATIVE NEGATIVE  ?  Comment: (NOTE) ?SARS-CoV-2 target nucleic acids are NOT DETECTED. ? ?The SARS-CoV-2 RNA is generally detectable in upper respiratory ?specimens during the acute phase of infection. The lowest ?concentration of SARS-CoV-2 viral copies this assay can detect is ?138 copies/mL. A negative result does not preclude SARS-Cov-2 ?infection and should not be used as the sole basis for treatment or ?other patient management decisions. A negative result may occur with  ?improper specimen collection/handling, submission of specimen other ?than nasopharyngeal swab, presence of viral mutation(s) within the ?areas targeted by this assay, and inadequate number of viral ?copies(<138 copies/mL). A negative result must be combined with ?clinical observations, patient history, and epidemiological ?information. The expected result is Negative. ? ?Fact Sheet for Patients:  ?BloggerCourse.comhttps://www.fda.gov/media/152166/download ? ?Fact Sheet for Healthcare Providers:  ?SeriousBroker.ithttps://www.fda.gov/media/152162/download ? ?This test is no t yet approved or cleared by the [place] FDA and  ?has been authorized for detection and/or diagnosis of SARS-CoV-2 by ?FDA under an Emergency Use Authorization (EUA). This EUA will remain  ?in effect (meaning this test can be used) for the duration of the ?COVID-19 declaration under Section 564(b)(1) of the Act, 21 ?U.S.C.section 360bbb-3(b)(1), unless the authorization is terminated  ?or revoked sooner.  ? ? ?  ? Influenza A by PCR NEGATIVE NEGATIVE  ? Influenza B by PCR NEGATIVE NEGATIVE  ?  Comment:  (NOTE) ?The Xpert Xpress SARS-CoV-2/FLU/RSV plus assay is intended as an aid ?in the diagnosis of influenza from Nasopharyngeal swab specimens and ?should not be used as a sole basis for treatment. Nasal washings a

## 2021-11-26 NOTE — ED Notes (Signed)
Pt reports he was recently in alcohol detox and was discharged on Sunday.  Pt relapsed last night /this morning and took a large amount of Prozac while he was drinking.  Pt is unsure why he took the pills.  Currently, denies SI and denies previous attempts.   ?

## 2021-11-26 NOTE — ED Notes (Signed)
Alexander Rowland, pharmacist, with poison control recommends observation for 6-8 hours after ingestion of fluoxitine and repeating EKG at 6 hours after ingestion (8am) and treating with PRN benzodiazepines if needed for seizures or agitation. ?

## 2021-11-26 NOTE — ED Notes (Signed)
Per poison Control- if pts QTC is <500, he can he medically cleared. Pt QTC on recent EKG is 427.  ?

## 2021-11-27 ENCOUNTER — Inpatient Hospital Stay
Admission: AD | Admit: 2021-11-27 | Discharge: 2021-12-02 | DRG: 897 | Payer: 59 | Source: Intra-hospital | Attending: Psychiatry | Admitting: Psychiatry

## 2021-11-27 ENCOUNTER — Encounter: Payer: Self-pay | Admitting: Psychiatry

## 2021-11-27 ENCOUNTER — Other Ambulatory Visit: Payer: Self-pay

## 2021-11-27 DIAGNOSIS — F332 Major depressive disorder, recurrent severe without psychotic features: Secondary | ICD-10-CM | POA: Insufficient documentation

## 2021-11-27 DIAGNOSIS — Y908 Blood alcohol level of 240 mg/100 ml or more: Secondary | ICD-10-CM | POA: Diagnosis present

## 2021-11-27 DIAGNOSIS — T43222A Poisoning by selective serotonin reuptake inhibitors, intentional self-harm, initial encounter: Secondary | ICD-10-CM | POA: Diagnosis present

## 2021-11-27 DIAGNOSIS — F1010 Alcohol abuse, uncomplicated: Secondary | ICD-10-CM

## 2021-11-27 DIAGNOSIS — F1994 Other psychoactive substance use, unspecified with psychoactive substance-induced mood disorder: Secondary | ICD-10-CM | POA: Diagnosis present

## 2021-11-27 DIAGNOSIS — T43221A Poisoning by selective serotonin reuptake inhibitors, accidental (unintentional), initial encounter: Secondary | ICD-10-CM

## 2021-11-27 LAB — BASIC METABOLIC PANEL
Anion gap: 8 (ref 5–15)
BUN: 10 mg/dL (ref 6–20)
CO2: 30 mmol/L (ref 22–32)
Calcium: 9.7 mg/dL (ref 8.9–10.3)
Chloride: 100 mmol/L (ref 98–111)
Creatinine, Ser: 0.9 mg/dL (ref 0.61–1.24)
GFR, Estimated: >60 mL/min (ref >60)
Glucose, Bld: 104 mg/dL — ABNORMAL HIGH (ref 70–99)
Potassium: 4 mmol/L (ref 3.5–5.1)
Sodium: 138 mmol/L (ref 135–145)

## 2021-11-27 MED ORDER — THIAMINE HCL 100 MG/ML IJ SOLN: 100.0000 mg | Freq: Every day | INTRAMUSCULAR | Status: DC

## 2021-11-27 MED ORDER — HYDROXYZINE HCL 50 MG PO TABS: 50.0000 mg | ORAL_TABLET | Freq: Three times a day (TID) | ORAL | Status: DC | PRN

## 2021-11-27 MED ORDER — LORAZEPAM 2 MG PO TABS
0.0000 mg | ORAL_TABLET | Freq: Four times a day (QID) | ORAL | Status: AC
  Administered 2021-11-27: 2 mg via ORAL
  Filled 2021-11-27: qty 1

## 2021-11-27 MED ORDER — NICOTINE 21 MG/24HR TD PT24
21.0000 mg | MEDICATED_PATCH | Freq: Every day | TRANSDERMAL | Status: DC
  Filled 2021-11-27: qty 1

## 2021-11-27 MED ORDER — ONDANSETRON HCL 4 MG PO TABS: 4.0000 mg | ORAL_TABLET | Freq: Three times a day (TID) | ORAL | Status: DC | PRN

## 2021-11-27 MED ORDER — TRAZODONE HCL 100 MG PO TABS
100.0000 mg | ORAL_TABLET | Freq: Every evening | ORAL | Status: DC | PRN
  Administered 2021-11-30: 100 mg via ORAL
  Filled 2021-11-27 (×2): qty 1

## 2021-11-27 MED ORDER — LORAZEPAM 2 MG/ML IJ SOLN: 0.0000 mg | Freq: Four times a day (QID) | INTRAMUSCULAR | Status: AC

## 2021-11-27 MED ORDER — THIAMINE HCL 100 MG PO TABS
100.0000 mg | ORAL_TABLET | Freq: Every day | ORAL | Status: DC
  Administered 2021-11-28 – 2021-12-02 (×5): 100 mg via ORAL
  Filled 2021-11-27 (×5): qty 1

## 2021-11-27 MED ORDER — ALUM & MAG HYDROXIDE-SIMETH 200-200-20 MG/5ML PO SUSP: 30.0000 mL | ORAL | Status: DC | PRN

## 2021-11-27 MED ORDER — ACETAMINOPHEN 325 MG PO TABS: 650.0000 mg | ORAL_TABLET | Freq: Four times a day (QID) | ORAL | Status: DC | PRN

## 2021-11-27 MED ORDER — MAGNESIUM HYDROXIDE 400 MG/5ML PO SUSP: 30.0000 mL | Freq: Every day | ORAL | Status: DC | PRN

## 2021-11-27 NOTE — ED Provider Notes (Signed)
Emergency Medicine Observation Re-evaluation Note ? ?Alexander Rowland is a 36 y.o. male, seen on rounds today.  Pt initially presented to the ED for complaints of Drug Overdose ?Currently, the patient is sleeping. ? ?Physical Exam  ?BP 105/71 (BP Location: Right Arm)   Pulse 71   Temp (!) 97.4 ?F (36.3 ?C) (Oral)   Resp 16   Ht 5\' 11"  (1.803 m)   Wt 108.9 kg   SpO2 98%   BMI 33.47 kg/m?  ?Physical Exam ?General: Sleeping, nondistressed ?Cardiac: Extremities are perfused ?Lungs: Breathing even and unlabored ?Psych: Deferred ? ?ED Course / MDM  ?EKG:EKG Interpretation ? ?Date/Time:  Tuesday November 26 2021 13:31:52 EDT ?Ventricular Rate:  80 ?PR Interval:  152 ?QRS Duration: 83 ?QT Interval:  370 ?QTC Calculation: 427 ?R Axis:   39 ?Text Interpretation: Sinus rhythm Normal ECG When compared with ECG of EARLIER SAME DATE No significant change was found Confirmed by 03-28-2004 (Dione Booze) on 11/27/2021 1:07:17 AM ? ?I have reviewed the labs performed to date as well as medications administered while in observation.  Recent changes in the last 24 hours include none. ? ?Plan  ?Current plan is for inpatient treatment. ? Alexander Rowland is not under involuntary commitment. ? ? ?  ?Casimiro Needle, MD ?11/27/21 1118 ? ?

## 2021-11-27 NOTE — Progress Notes (Signed)
36 years old male patient admitted after an overdose of 20 Prozac with alcohol as a suicidal attempt. Patient pleasant and cooperative during admission assessment. Patient positive for tremors and mild anxiety as withdrawal symptoms. Patient has a long history of alcohol abuse and been to various rehab programs. Patient currently unemployed and lives with his parents. Patient denies SI,HI & AVH at this time. Skin assessment and body search done with [facility] RN. No contraband found. Oriented to unit and dinner provided. Support and encouragement given. ?

## 2021-11-27 NOTE — Progress Notes (Signed)

## 2021-11-27 NOTE — BH Assessment (Signed)
Patient is to be admitted to [facility] BMU today 11/27/21 by Dr. Toni Amend.  ?Attending Physician will be Dr.  Toni Amend .   ?Patient has been assigned to room 323, by [facility] Charge Nurse, Ivonne Andrew.   ? ?** Call report to:[phone] ** ? ?ER staff is aware of the admission: ? ?Sue Lush, Patient Access.  ?

## 2021-11-27 NOTE — ED Notes (Signed)
Patient has been alert this shift.  Patient ate breakfast. Patient medication compliant. Patient flat and guarded. Appropriate with staff.  ?

## 2021-11-27 NOTE — ED Notes (Signed)
Patient DC off unit to [facility]. Patient alert, calm, cooperative, no s/s of distress. DC information and belongings given to sheriff. Patient ambulatory off unit, escorted and transported by [facility].  ?

## 2021-11-27 NOTE — Tx Team (Signed)
Initial Treatment Plan ?11/27/2021 ?6:51 PM ?[name] ?MCN:[id number] ? ? ? ?PATIENT STRESSORS: ?Health problems   ?Substance abuse   ? ? ?PATIENT STRENGTHS: ?Ability for insight  ?Average or above average intelligence  ?Communication skills  ?Physical Health  ?Supportive family/friends  ?Work skills  ? ? ?PATIENT IDENTIFIED PROBLEMS: ?Suicidal attempt  ?Alcohol abuse  ?  ?  ?  ?  ?  ?  ?  ?  ? ?DISCHARGE CRITERIA:  ?Ability to meet basic life and health needs ?Medical problems require only outpatient monitoring ?Safe-care adequate arrangements made ?Verbal commitment to aftercare and medication compliance ? ?PRELIMINARY DISCHARGE PLAN: ?Attend aftercare/continuing care group ?Return to previous living arrangement ? ?PATIENT/FAMILY INVOLVEMENT: ?This treatment plan has been presented to and reviewed with the patient, Alexander Rowland, and/or family member, .  The patient and family have been given the opportunity to ask questions and make suggestions. ? ?Leonarda Salon, RN ?11/27/2021, 6:51 PM ?

## 2021-11-27 NOTE — Progress Notes (Signed)
[facility] LCSW Progress Note ?  ?11/27/2021    3:29 PM ? ?Alexander Rowland  ? ?[id number]  ? ?Type of Contact and Topic:  Psychiatric Bed Placement  ? ?Pt accepted to [facility] 323 ? ?Patient meets inpatient criteria per Dahlia Byes, NP  ? ?The attending provider will be Dr. Toni Amend ? ?Call report to [phone] ? ?Lum Babe, RN @ [facility] notified.    ? ?Pt scheduled  to [facility] at [facility] TODAY.  ? ?Damita Dunnings, MSW, LCSW-A  ?3:31 PM 11/27/2021   ?  ? ?  ?  ? ? ? ? ?  ?

## 2021-11-28 ENCOUNTER — Encounter: Payer: Self-pay | Admitting: Psychiatry

## 2021-11-28 DIAGNOSIS — T43221A Poisoning by selective serotonin reuptake inhibitors, accidental (unintentional), initial encounter: Secondary | ICD-10-CM

## 2021-11-28 DIAGNOSIS — F1994 Other psychoactive substance use, unspecified with psychoactive substance-induced mood disorder: Principal | ICD-10-CM

## 2021-11-28 DIAGNOSIS — F1010 Alcohol abuse, uncomplicated: Secondary | ICD-10-CM

## 2021-11-28 NOTE — Progress Notes (Signed)
Patient is walking the halls of the unit, reading a book. He stated to this writer, during assessment, that he likes to read. Patient remains safe on the unit. ?

## 2021-11-28 NOTE — Plan of Care (Signed)
?  Problem: Education: ?Goal: Knowledge of [facility] Education information/materials will improve ?Outcome: Progressing ?Goal: Emotional status will improve ?Outcome: Progressing ?Goal: Mental status will improve ?Outcome: Progressing ?Goal: Verbalization of understanding the information provided will improve ?Outcome: Progressing ?  ?Problem: Health Behavior/Discharge Planning: ?Goal: Identification of resources available to assist in meeting health care needs will improve ?Outcome: Progressing ?Goal: Compliance with treatment plan for underlying cause of condition will improve ?Outcome: Progressing ?  ?Problem: Education: ?Goal: Knowledge of disease or condition will improve ?Outcome: Progressing ?Goal: Understanding of discharge needs will improve ?Outcome: Progressing ?  ?Problem: Physical Regulation: ?Goal: Complications related to the disease process, condition or treatment will be avoided or minimized ?Outcome: Progressing ?  ?Problem: Safety: ?Goal: Ability to remain free from injury will improve ?Outcome: Progressing ?  ?Problem: Safety: ?Goal: Ability to remain free from injury will improve ?Outcome: Progressing ?  ?

## 2021-11-28 NOTE — Plan of Care (Signed)
D- Patient alert and oriented. Patient presents in a pleasant mood on assessment reporting that he slept good last night and had no complaints to voice to this Clinical research associate. Patient denies SI, HI, AVH, and pain at this time. Patient also denies any signs/symptoms of depression/anxiety, stating that overall he is feeling "good". Patient's goal for today is "working on going home", in which he will "read and comply with anything you all need me to do", in order to achieve his goal. ? ?A- Some scheduled medications administered to patient, per MD orders. Support and encouragement provided. Routine safety checks conducted every 15 minutes. Patient informed to notify staff with problems or concerns. ? ?R- No adverse drug reactions noted. Patient contracts for safety at this time. Patient compliant with medications and treatment plan. Patient receptive, calm, and cooperative. Patient interacts well with others on the unit. Patient remains safe at this time. ? ?Problem: Education: ?Goal: Knowledge of [place] [facility] Education information/materials will improve ?Outcome: Progressing ?Goal: Emotional status will improve ?Outcome: Progressing ?Goal: Mental status will improve ?Outcome: Progressing ?Goal: Verbalization of understanding the information provided will improve ?Outcome: Progressing ?  ?Problem: Health Behavior/Discharge Planning: ?Goal: Identification of resources available to assist in meeting health care needs will improve ?Outcome: Progressing ?Goal: Compliance with treatment plan for underlying cause of condition will improve ?Outcome: Progressing ?  ?Problem: Education: ?Goal: Knowledge of disease or condition will improve ?Outcome: Progressing ?Goal: Understanding of discharge needs will improve ?Outcome: Progressing ?  ?Problem: Physical Regulation: ?Goal: Complications related to the disease process, condition or treatment will be avoided or minimized ?Outcome: Progressing ?  ?Problem: Safety: ?Goal: Ability  to remain free from injury will improve ?Outcome: Progressing ?  ?

## 2021-11-28 NOTE — [facility] Suicide Risk Assessment (Signed)
[facility] INPATIENT:  Family/Significant Other Suicide Prevention Education ? ?Suicide Prevention Education:  ?Patient Refusal for Family/Significant Other Suicide Prevention Education: The patient Alexander Rowland has refused to provide written consent for family/significant other to be provided Family/Significant Other Suicide Prevention Education during admission and/or prior to discharge.  Physician notified. ? ?SPE completed with pt, as pt refused to consent to family contact. SPI pamphlet provided to pt and pt was encouraged to share information with support network, ask questions, and talk about any concerns relating to SPE. Pt denies access to guns/firearms and verbalized understanding of information provided. Mobile Crisis information also provided to pt.  ? ?Harden Mo ?11/28/2021, 4:11 PM ?

## 2021-11-28 NOTE — H&P (Signed)
Psychiatric Admission Assessment Adult ? ?Patient Identification: Alexander Rowland ?MRN:  [id number] ?Date of Evaluation:  11/28/2021 ?Chief Complaint:  Severe recurrent major depression without psychotic features (HCC) [F33.2] ?Principal Diagnosis: Substance induced mood disorder (HCC) ?Diagnosis:  Principal Problem: ?  Substance induced mood disorder (HCC) ?Active Problems: ?  SSRI overdose ?  Alcohol abuse ? ?History of Present Illness: Patient seen and chart reviewed.  36 year old man with a history of alcohol abuse transferred to [place] from [place] under IVC.  He presented there on April 11 reporting that he had taken his entire bottle of fluoxetine the night before while drinking vodka.  Notes from [place] indicate that he told them he was wanting to die when he took the pills.  On interview with me today he says he was not wanting to die and does not believe he was ever suicidal although he admits that this leaves no logical explanation for his taking the pills.  Patient evidently had just left the detox program at [facility] that day several hours prior to what was recommended.  He says that he went home to his family and when they expressed anger and disappointment that he had left the detox program early rather than going directly to the residential program he became insulted and so he went to a hotel room to drink vodka.  Patient is able to recognize that this is an irrational and self-defeating behavior but still tends to defend it in conversation.  He states his mood today is fine.  Denies suicidal ideation.  Denies any hallucinations.  Not feeling shaky.  Patient claims to feel that he has enough control that he could be discharged from the hospital and go out into the world for some period of time before going back to the residential program and that he would be able to stay sober during that time.  He denies that he has been using any other drugs although his drug screen was positive for  cannabis. ?Associated Signs/Symptoms: ?Depression Symptoms:  depressed mood, ?fatigue, ?suicidal attempt, ?loss of energy/fatigue, ?Duration of Depression Symptoms: No data recorded ?(Hypo) Manic Symptoms:  Distractibility, ?Impulsivity, ?Anxiety Symptoms:   Nonspecific ?Psychotic Symptoms:   Denies any ?PTSD Symptoms: ?Negative ?Total Time spent with patient: 1 hour ? ?Past Psychiatric History: Patient has struggled with alcohol abuse for about 11 years it seems.  As far as we can tell his last hospitalization was in 2012 for alcohol withdrawal.  He says that over the course of these last 10 years he has had several detox stays and has maintained sobriety for various episodes of about 30 days or so.  His longest sobriety was 89 days after going to [facility].  He seems to have extensive experience with Alcoholics Anonymous groups although it sounds like he does not necessarily match up his participation with AA with actually stopping drinking.  He has no history that he reports of severe withdrawal including no history of seizures and no history of delirium tremens.  Had never been on antidepressant medicine prior to the very recent prescription of the fluoxetine.  Denies ever having any past suicide attempts or violence. ? ?Is the patient at risk to self? Yes.    ?Has the patient been a risk to self in the past 6 months? Yes.    ?Has the patient been a risk to self within the distant past? Yes.    ?Is the patient a risk to others? No.  ?Has the patient been a risk to others  in the past 6 months? No.  ?Has the patient been a risk to others within the distant past? No.  ? ?Prior Inpatient Therapy:   ?Prior Outpatient Therapy:   ? ?Alcohol Screening: 1. How often do you have a drink containing alcohol?: 4 or more times a week ?2. How many drinks containing alcohol do you have on a typical day when you are drinking?: 10 or more ?3. How often do you have six or more drinks on one occasion?: Daily or almost  daily ?AUDIT-C Score: 12 ?4. How often during the last year have you found that you were not able to stop drinking once you had started?: Less than monthly ?5. How often during the last year have you failed to do what was normally expected from you because of drinking?: Less than monthly ?6. How often during the last year have you needed a first drink in the morning to get yourself going after a heavy drinking session?: Weekly ?7. How often during the last year have you had a feeling of guilt of remorse after drinking?: Monthly ?8. How often during the last year have you been unable to remember what happened the night before because you had been drinking?: Monthly ?9. Have you or someone else been injured as a result of your drinking?: No ?10. Has a relative or friend or a doctor or another health worker been concerned about your drinking or suggested you cut down?: Yes, but not in the last year ?Alcohol Use Disorder Identification Test Final Score (AUDIT): 23 ?Alcohol Brief Interventions/Follow-up: Alcohol education/Brief advice ?Substance Abuse History in the last 12 months:  Yes.   ?Consequences of Substance Abuse: ?Obviously multiple severe consequences.  He had been engaged until December when he and his fianc? split up in an incident that sounds like it was fueled by alcohol.  He recently was fired from his job again because of the consequences of his drinking.  His relationship with his parents seems to be the only remaining support in his life and that is suffering as well because of his behavior. ?Previous Psychotropic Medications: Yes  ?Psychological Evaluations: Yes  ?Past Medical History:  ?Past Medical History:  ?Diagnosis Date  ? Alcoholism (HCC)   ? Depression   ? History reviewed. No pertinent surgical history. ?Family History:  ?Family History  ?Problem Relation Age of Onset  ? Alcoholism Father   ? ?Family Psychiatric  History: Evidently no significant history.  The chart says alcohol abuse in his  father but the patient claims that his father does not abuse alcohol. ?Tobacco Screening: Have you used any form of tobacco in the last 30 days? (Cigarettes, Smokeless Tobacco, Cigars, and/or Pipes): No ?Social History:  ?Social History  ? ?Substance and Sexual Activity  ?Alcohol Use Yes  ?   ?Social History  ? ?Substance and Sexual Activity  ?Drug Use Not on file  ?  ?Additional Social History: ?  ?   ?  ?  ?  ?  ?  ?  ?  ?  ?  ?  ? ?Allergies:   ?Allergies  ?Allergen Reactions  ? Asa [Aspirin] Anaphylaxis  ? ?Lab Results:  ?Results for orders placed or performed during the hospital encounter of 11/26/21 (from the past 48 hour(s))  ?Rapid urine drug screen (hospital performed)     Status: Abnormal  ? Collection Time: 11/26/21 11:41 AM  ?Result Value Ref Range  ? Opiates NONE DETECTED NONE DETECTED  ? Cocaine NONE DETECTED NONE DETECTED  ?  Benzodiazepines POSITIVE (A) NONE DETECTED  ? Amphetamines NONE DETECTED NONE DETECTED  ? Tetrahydrocannabinol POSITIVE (A) NONE DETECTED  ? Barbiturates NONE DETECTED NONE DETECTED  ?  Comment: (NOTE) ?DRUG SCREEN FOR MEDICAL PURPOSES ?ONLY.  IF CONFIRMATION IS NEEDED ?FOR ANY PURPOSE, NOTIFY LAB ?WITHIN 5 DAYS. ? ?LOWEST DETECTABLE LIMITS ?FOR URINE DRUG SCREEN ?Drug Class                     Cutoff (ng/mL) ?Amphetamine and metabolites    1000 ?Barbiturate and metabolites    200 ?Benzodiazepine                 200 ?Tricyclics and metabolites     300 ?Opiates and metabolites        300 ?Cocaine and metabolites        300 ?THC                            50 ?Performed at [facility], [address] ?[place], [place] [id number] ?  ?Basic metabolic panel     Status: Abnormal  ? Collection Time: 11/27/21  1:53 PM  ?Result Value Ref Range  ? Sodium 138 135 - 145 mmol/L  ? Potassium 4.0 3.5 - 5.1 mmol/L  ? Chloride 100 98 - 111 mmol/L  ? CO2 30 22 - 32 mmol/L  ? Glucose, Bld 104 (H) 70 - 99 mg/dL  ?  Comment: Glucose reference range applies only to samples taken  after fasting for at least 8 hours.  ? BUN 10 6 - 20 mg/dL  ? Creatinine, Ser 0.90 0.61 - 1.24 mg/dL  ? Calcium 9.7 8.9 - 10.3 mg/dL  ? GFR, Estimated >60 >60 mL/min  ?  Comment: (NOTE) ?Calculated using the CKD-EPI Creat

## 2021-11-28 NOTE — Progress Notes (Signed)
Patient refused his Nicotine patch, stating to this writer that he does not need any of that stuff. MD will be notified. ?

## 2021-11-28 NOTE — Group Note (Signed)
[facility] LCSW Group Therapy Note ? ? ?Group Date: 11/28/2021 ?Start Time: 1315 ?End Time: 1415 ? ? ?Type of Therapy/Topic:  Group Therapy:  Balance in Life ? ?Participation Level:  Active  ? ?Description of Group:   ? This group will address the concept of balance and how it feels and looks when one is unbalanced. Patients will be encouraged to process areas in their lives that are out of balance, and identify reasons for remaining unbalanced. Facilitators will guide patients utilizing problem- solving interventions to address and correct the stressor making their life unbalanced. Understanding and applying boundaries will be explored and addressed for obtaining  and maintaining a balanced life. Patients will be encouraged to explore ways to assertively make their unbalanced needs known to significant others in their lives, using other group members and facilitator for support and feedback. ? ?Therapeutic Goals: ?Patient will identify two or more emotions or situations they have that consume much of in their lives. ?Patient will identify signs/triggers that life has become out of balance:  ?Patient will identify two ways to set boundaries in order to achieve balance in their lives:  ?Patient will demonstrate ability to communicate their needs through discussion and/or role plays ? ?Summary of Patient Progress: ?Patient was present in group.  Patient was attentive and supportive of others in group.  Patient shared how he uses his faith in a higher power to cope.  He also reported how he uses his community support system when additional support is needed.  ? ?Therapeutic Modalities:   ?Cognitive Behavioral Therapy ?Solution-Focused Therapy ?Assertiveness Training ? ? ?Harden Mo, LCSW ?

## 2021-11-28 NOTE — [facility] Suicide Risk Assessment (Signed)
[facility] Admission Suicide Risk Assessment ? ? ?Nursing information obtained from:  Patient ?Demographic factors:  Male, Unemployed, Caucasian ?Current Mental Status:  NA ?Loss Factors:  NA ?Historical Factors:  Impulsivity ?Risk Reduction Factors:  Living with another person, especially a relative ? ?Total Time spent with patient: 1 hour ?Principal Problem: Substance induced mood disorder (HCC) ?Diagnosis:  Principal Problem: ?  Substance induced mood disorder (HCC) ?Active Problems: ?  SSRI overdose ?  Alcohol abuse ? ?Subjective Data: Patient seen and chart reviewed.  36 year old man brought to [place] from [place] under IVC.  He presented to the hospital there on the 11th having taken an entire bottle of fluoxetine the night before while drinking.  Notes from [place] indicate that he voiced suicidal ideation to them but to me today he claims that he had no intention or wish to die when he took the Prozac.  He admits that this leaves no logical reason for why he did it but insists that he has no suicidal thoughts or wish at this time.  Does not present as psychotic.  Seems a little fuzzy in his memory about the events of presentation. ? ?Continued Clinical Symptoms:  ?Alcohol Use Disorder Identification Test Final Score (AUDIT): 23 ?The "Alcohol Use Disorders Identification Test", Guidelines for Use in Primary Care, Second Edition.  World Science writer [facility]). ?Score between 0-7:  no or low risk or alcohol related problems. ?Score between 8-15:  moderate risk of alcohol related problems. ?Score between 16-19:  high risk of alcohol related problems. ?Score 20 or above:  warrants further diagnostic evaluation for alcohol dependence and treatment. ? ? ?CLINICAL FACTORS:  ? Alcohol/Substance Abuse/Dependencies ? ? ?Musculoskeletal: ?Strength & Muscle Tone: within normal limits ?Gait & Station: normal ?Patient leans: N/A ? ?Psychiatric Specialty Exam: ? ?Presentation  ?General Appearance: Casual; Fairly  Groomed ? ?Eye Contact:Good ? ?Speech:Clear and Coherent; Normal Rate ? ?Speech Volume:Normal ? ?Handedness:Right ? ? ?Mood and Affect  ?Mood:Depressed; Anxious ? ?Affect:Congruent ? ? ?Thought Process  ?Thought Processes:Coherent; Goal Directed ? ?Descriptions of Associations:Intact ? ?Orientation:Full (Time, Place and Person) ? ?Thought Content:Logical ? ?History of Schizophrenia/Schizoaffective disorder:No data recorded ?Duration of Psychotic Symptoms:No data recorded ?Hallucinations:No data recorded ?Ideas of Reference:None ? ?Suicidal Thoughts:No data recorded ?Homicidal Thoughts:No data recorded ? ?Sensorium  ?Memory:Immediate Good; Recent Good; Remote Good ? ?Judgment:Fair ? ?Insight:Good ? ? ?Executive Functions  ?Concentration:Good ? ?Attention Span:Good ? ?Recall:Good ? ?Fund of Knowledge:Good ? ?Language:Good ? ? ?Psychomotor Activity  ?Psychomotor Activity:No data recorded ? ?Assets  ?Assets:Communication Skills; Desire for Improvement; Housing; Physical Health ? ? ?Sleep  ?Sleep:No data recorded ? ? ?Physical Exam: ?Physical Exam ?Vitals and nursing note reviewed.  ?Constitutional:   ?   Appearance: Normal appearance.  ?HENT:  ?   Head: Normocephalic and atraumatic.  ?   Mouth/Throat:  ?   Pharynx: Oropharynx is clear.  ?Eyes:  ?   Pupils: Pupils are equal, round, and reactive to light.  ?Cardiovascular:  ?   Rate and Rhythm: Normal rate and regular rhythm.  ?Pulmonary:  ?   Effort: Pulmonary effort is normal.  ?   Breath sounds: Normal breath sounds.  ?Abdominal:  ?   General: Abdomen is flat.  ?   Palpations: Abdomen is soft.  ?Musculoskeletal:     ?   General: Normal range of motion.  ?Skin: ?   General: Skin is warm and dry.  ?Neurological:  ?   General: No focal deficit present.  ?   Mental Status: He is  alert. Mental status is at baseline.  ?Psychiatric:     ?   Attention and Perception: Attention normal.     ?   Mood and Affect: Mood normal. Affect is blunt.     ?   Speech: Speech normal.     ?    Behavior: Behavior normal.     ?   Thought Content: Thought content normal.     ?   Cognition and Memory: Cognition normal. Memory is impaired.     ?   Judgment: Judgment is impulsive.  ? ?Review of Systems  ?Constitutional: Negative.   ?HENT: Negative.    ?Eyes: Negative.   ?Respiratory: Negative.    ?Cardiovascular: Negative.   ?Gastrointestinal: Negative.   ?Musculoskeletal: Negative.   ?Skin: Negative.   ?Neurological: Negative.   ?Psychiatric/Behavioral:  Positive for memory loss and substance abuse. Negative for depression, hallucinations and suicidal ideas. The patient is not nervous/anxious and does not have insomnia.   ?Blood pressure 126/87, pulse 85, temperature 97.8 ?F (36.6 ?C), temperature source Oral, resp. rate 18, height 5\' 11"  (1.803 m), weight 110.2 kg, SpO2 100 %. Body mass index is 33.89 kg/m?. ? ? ?COGNITIVE FEATURES THAT CONTRIBUTE TO RISK:  ?Thought constriction (tunnel vision)   ? ?SUICIDE RISK:  ? Mild:  Suicidal ideation of limited frequency, intensity, duration, and specificity.  There are no identifiable plans, no associated intent, mild dysphoria and related symptoms, good self-control (both objective and subjective assessment), few other risk factors, and identifiable protective factors, including available and accessible social support. ? ?PLAN OF CARE: Continue 15-minute checks.  Monitor for any signs of withdrawal.  Engage in individual and group therapy and treatment team meeting.  Reassess dangerousness prior to making a discharge plan ? ?I certify that inpatient services furnished can reasonably be expected to improve the patient's condition.  ? ? , MD ?11/28/2021, 11:19 AM ? ?
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
## 2021-11-28 NOTE — Progress Notes (Signed)
Recreation Therapy Notes ? ?Date: 11/28/2021 ? ?Time: 10:50am   ? ?Location: Craft room    ? ?Behavioral response: Appropriate ? ?Intervention Topic: Coping Skills    ? ?Discussion/Intervention:  ?Group content on today was focused on coping skills. The group defined what coping skills are and when they normally use coping skills. Individuals described how they normally cope with thing and the coping skills they normally use. Patients expressed why it is important to cope with things and how not coping with things can affect you. The group participated in the intervention ?My coping box? and made coping boxes while adding coping skills they could use in the future to the box. ?Clinical Observations/Feedback: ?Patient came to late and was focused on what peers and staff had to say about coping skills. Individual was social with peers and staff while participating in the intervention.    ?Alexander Rowland LRT/CTRS  ? ? ? ? ? ? ? ? ?Alexander Rowland ?11/28/2021 12:31 PM ?

## 2021-11-28 NOTE — Progress Notes (Signed)
[facility] LCSW Progress Note ?  ?11/28/2021    4:08 PM ? ?Tirrell Buchberger  ? ?[id number]  ? ?Type of Contact and Topic:  SUD tx referral   ? ?Patient expressed interest in attending residential treatment at [facility] in [place], [place].  ? ?Supporting documentation faxed to [facility] at [phone]. ?  ?Signed:  ?Corky Crafts, MSW, LCSWA, LCAS ?11/28/2021 4:08 PM ?  ?  ? ?

## 2021-11-29 ENCOUNTER — Other Ambulatory Visit: Payer: Self-pay

## 2021-11-29 DIAGNOSIS — F1994 Other psychoactive substance use, unspecified with psychoactive substance-induced mood disorder: Principal | ICD-10-CM

## 2021-11-29 MED ORDER — TRAZODONE HCL 100 MG PO TABS
100.0000 mg | ORAL_TABLET | Freq: Every evening | ORAL | 0 refills | Status: DC | PRN
  Filled 2021-11-29: qty 14, 14d supply, fill #0

## 2021-11-29 NOTE — Progress Notes (Signed)
Patient has been in bed for majority of the afternoon. Patient remains safe on the unit. ?

## 2021-11-29 NOTE — Progress Notes (Signed)
Patient has been walking the halls since start of shift. Sometimes reading a book other times just pacing.  Reports having nothing better to do.  He does periodically go to the dayroom and hang out with his peers who he appears to get along very well with. He is pleasant and cooperative and easy to engage in conversation.  He has no night medication and states that he does not need aid in sleeping.  He denies si hi avh at this encounter. He does endorse having some anxiety and depression, but states that it is manageable. He reports having called [facility] earlier in the day to do his part in getting readmitted to their program and is hoping to be able to return soon. Will continue to monitor with q 15 minute safety checks. Encouraged to seek staff with any concerns. ? ? ?C Butler-Nicholson, LPN ?

## 2021-11-29 NOTE — Progress Notes (Signed)
Recreation Therapy Notes ? ?INPATIENT RECREATION THERAPY ASSESSMENT ? ?Patient Details ?Name: Alexander Rowland ?MRN: [id number] ?DOB: May 26, 1986 ?Today's Date: 11/29/2021 ?      ?Information Obtained From: ?Patient ? ?Able to Participate in Assessment/Interview: ?Yes ? ?Patient Presentation: ?Responsive ? ?Reason for Admission (Per Patient): ?Active Symptoms, Substance Abuse ? ?Patient Stressors: ?  ? ?Coping Skills:   ?Substance Abuse ? ?Leisure Interests (2+):  ? (None) ? ?Frequency of Recreation/Participation: ?  ? ?Awareness of Community Resources:  ?Yes ? ?Community Resources:  ?Other (Comment) (AA) ? ?Current Use: ?Yes ? ?If no, Barriers?: ?  ? ?Expressed Interest in [employer] Information: ?Yes ? ?[place] of Residence:  ?[place] ? ?Patient Main Form of Transportation: ?Car ? ?Patient Strengths:  ?Time management ? ?Patient Identified Areas of Improvement:  ?My drinking ? ?Patient Goal for Hospitalization:  ?To get better ? ?Current SI (including self-harm):  ?No ? ?Current HI:  ?No ? ?Current AVH: ?No ? ?Staff Intervention Plan: ?Group Attendance, Collaborate with Interdisciplinary Treatment Team ? ?Consent to Intern Participation: ?N/A ? ?Alexander Rowland ?11/29/2021, 2:57 PM ?

## 2021-11-29 NOTE — Progress Notes (Signed)
[facility] ? ?11/29/2021 3:18 PM ?Alexander CurryBeau Gregory Clink  ?MRN:  [id number] ?Subjective: Patient seen and chart reviewed.  Patient attended treatment team today.  Physically he reports himself feeling stable and vital signs are stable.  No sign of alcohol withdrawal symptoms.  Patient looks anxious paces around the ward a lot but certainly does not seem like he is having akathisia.  Attending treatment team he repeated his request to be discharged so that he could go see his mother on her birthday but he has been accepted into a rehab program to start on Monday ?Principal Problem: Substance induced mood disorder (HCC) ?Diagnosis: Principal Problem: ?  Substance induced mood disorder (HCC) ?Active Problems: ?  SSRI overdose ?  Alcohol abuse ? ?Total Time spent with patient: 30 minutes ? ?Past Psychiatric History: Past history of alcohol abuse with struggles to maintain sobriety ? ?Past Medical History:  ?Past Medical History:  ?Diagnosis Date  ? Alcoholism (HCC)   ? Depression   ? History reviewed. No pertinent surgical history. ?Family History:  ?Family History  ?Problem Relation Age of Onset  ? Alcoholism Father   ? ?Family Psychiatric  History: See previous ?Social History:  ?Social History  ? ?Substance and Sexual Activity  ?Alcohol Use Yes  ?   ?Social History  ? ?Substance and Sexual Activity  ?Drug Use Not on file  ?  ?Social History  ? ?Socioeconomic History  ? Marital status: Single  ?  Spouse name: Not on file  ? Number of children: Not on file  ? Years of education: Not on file  ? Highest education level: Not on file  ?Occupational History  ? Not on file  ?Tobacco Use  ? Smoking status: Never  ? Smokeless tobacco: Never  ?Vaping Use  ? Vaping Use: Some days  ?Substance and Sexual Activity  ? Alcohol use: Yes  ? Drug use: Not on file  ? Sexual activity: Not on file  ?Other Topics Concern  ? Not on file  ?Social History Narrative  ? Not on file  ? ?Social Determinants of Health  ? ?Financial Resource  Strain: Not on file  ?Food Insecurity: Not on file  ?Transportation Needs: Not on file  ?Physical Activity: Not on file  ?Stress: Not on file  ?Social Connections: Not on file  ? ?Additional Social History:  ?  ?  ?  ?  ?  ?  ?  ?  ?  ?  ?  ? ?Sleep: Fair ? ?Appetite:  Fair ? ?Current Medications: ?Current Facility-Administered Medications  ?Medication Dose Route Frequency Provider Last Rate Last Admin  ? acetaminophen (TYLENOL) tablet 650 mg  650 mg Oral Q6H PRN Kalei Meda, Jackquline DenmarkJohn T, MD      ? alum & mag hydroxide-simeth (MAALOX/MYLANTA) 200-200-20 MG/5ML suspension 30 mL  30 mL Oral Q4H PRN Stacie Knutzen, Jackquline DenmarkJohn T, MD      ? hydrOXYzine (ATARAX) tablet 50 mg  50 mg Oral TID PRN Zai Chmiel, Jackquline DenmarkJohn T, MD      ? magnesium hydroxide (MILK OF MAGNESIA) suspension 30 mL  30 mL Oral Daily PRN Evelina Lore T, MD      ? ondansetron (ZOFRAN) tablet 4 mg  4 mg Oral Q8H PRN Sequoyah Counterman T, MD      ? thiamine tablet 100 mg  100 mg Oral Daily Nakiah Osgood, Jackquline DenmarkJohn T, MD   100 mg at 11/29/21 [id number]  ? Or  ? thiamine (B-1) injection 100 mg  100 mg Intravenous Daily Atreus Hasz  T, MD      ? traZODone (DESYREL) tablet 100 mg  100 mg Oral QHS PRN Antuan Limes, [name], MD      ? ? ?Lab Results: No results found for this or any previous visit (from the past 48 hour(s)). ? ?Blood Alcohol level:  ?Lab Results  ?Component Value Date  ? ETH 287 (H) 11/26/2021  ? ? ?Metabolic Disorder Labs: ?No results found for: HGBA1C, MPG ?No results found for: PROLACTIN ?No results found for: CHOL, TRIG, HDL, CHOLHDL, VLDL, LDLCALC ? ?Physical Findings: ?AIMS:  , ,  ,  ,    ?CIWA:  CIWA-Ar Total: 0 ?COWS:    ? ?Musculoskeletal: ?Strength & Muscle Tone: within normal limits ?Gait & Station: normal ?Patient leans: N/A ? ?Psychiatric Specialty Exam: ? ?Presentation  ?General Appearance: Casual; Fairly Groomed ? ?Eye Contact:Good ? ?Speech:Clear and Coherent; Normal Rate ? ?Speech Volume:Normal ? ?Handedness:Right ? ? ?Mood and Affect  ?Mood:Depressed;  Anxious ? ?Affect:Congruent ? ? ?Thought Process  ?Thought Processes:Coherent; Goal Directed ? ?Descriptions of Associations:Intact ? ?Orientation:Full (Time, Place and Person) ? ?Thought Content:Logical ? ?History of Schizophrenia/Schizoaffective disorder:No data recorded ?Duration of Psychotic Symptoms:No data recorded ?Hallucinations:No data recorded ?Ideas of Reference:None ? ?Suicidal Thoughts:No data recorded ?Homicidal Thoughts:No data recorded ? ?Sensorium  ?Memory:Immediate Good; Recent Good; Remote Good ? ?Judgment:Fair ? ?Insight:Good ? ? ?Executive Functions  ?Concentration:Good ? ?Attention Span:Good ? ?Recall:Good ? ?Fund of Knowledge:Good ? ?Language:Good ? ? ?Psychomotor Activity  ?Psychomotor Activity:No data recorded ? ?Assets  ?Assets:Communication Skills; Desire for Improvement; Housing; Physical Health ? ? ?Sleep  ?Sleep:No data recorded ? ? ?Physical Exam: ?Physical Exam ?Vitals and nursing note reviewed.  ?Constitutional:   ?   Appearance: Normal appearance.  ?HENT:  ?   Head: Normocephalic and atraumatic.  ?   Mouth/Throat:  ?   Pharynx: Oropharynx is clear.  ?Eyes:  ?   Pupils: Pupils are equal, round, and reactive to light.  ?Cardiovascular:  ?   Rate and Rhythm: Normal rate and regular rhythm.  ?Pulmonary:  ?   Effort: Pulmonary effort is normal.  ?   Breath sounds: Normal breath sounds.  ?Abdominal:  ?   General: Abdomen is flat.  ?   Palpations: Abdomen is soft.  ?Musculoskeletal:     ?   General: Normal range of motion.  ?Skin: ?   General: Skin is warm and dry.  ?Neurological:  ?   General: No focal deficit present.  ?   Mental Status: He is alert. Mental status is at baseline.  ?Psychiatric:     ?   Mood and Affect: Mood normal.     ?   Thought Content: Thought content normal.  ? ?Review of Systems  ?Constitutional: Negative.   ?HENT: Negative.    ?Eyes: Negative.   ?Respiratory: Negative.    ?Cardiovascular: Negative.   ?Gastrointestinal: Negative.   ?Musculoskeletal: Negative.    ?Skin: Negative.   ?Neurological: Negative.   ?Psychiatric/Behavioral: Negative.    ?Blood pressure 123/90, pulse 72, temperature 97.9 ?F (36.6 ?C), temperature source Oral, resp. rate 18, height 5\' 11"  (1.803 m), weight 110.2 kg, SpO2 99 %. Body mass index is 33.89 kg/m?. ? ? ?Treatment Plan Summary: ?Daily contact with patient to assess and evaluate symptoms and progress in treatment, Medication management, and Plan explained to patient that in his own interest given that his risk of relapse is probably close to 100% if he were released again immediately we will be holding him in the hospital over the  weekend with the plan to transfer him directly to [facility] rehab residential program on Monday.  No change to medicine for now.  Preparations underway for discharge Monday morning ? ?Mordecai Rasmussen, MD ?11/29/2021, 3:18 PM ? ?

## 2021-11-29 NOTE — Progress Notes (Signed)
Recreation Therapy Notes ? ? ?Date: 11/29/2021 ? ?Time: 10:40 am   ? ?Location: Craft room    ? ?Behavioral response: N/A ?  ?Intervention Topic: Relaxation  ? ?Discussion/Intervention: ?Patient refused to attend group.  ? ?Clinical Observations/Feedback:  ?Patient refused to attend group.  ?  ?Miranda Frese LRT/CTRS ? ? ? ? ? ? ? ?Ivette Castronova ?11/29/2021 12:48 PM ?

## 2021-11-29 NOTE — Progress Notes (Signed)
Recreation Therapy Notes ? ?INPATIENT RECREATION TR PLAN ? ?Patient Details ?Name: Alexander Rowland ?MRN: [id number] ?DOB: 1985-09-16 ?Today's Date: 11/29/2021 ? ?Rec Therapy Plan ?Is patient appropriate for Therapeutic Recreation?: Yes ?Treatment times per week: at least 3 ?Estimated Length of Stay: 5-7 days ?TR Treatment/Interventions: Group participation (Comment) ? ?Discharge Criteria ?Pt will be discharged from therapy if:: Discharged ?Treatment plan/goals/alternatives discussed and agreed upon by:: Patient/family ? ?Discharge Summary ?  ? ? ?Bobbette Eakes ?11/29/2021, 2:59 PM ?

## 2021-11-29 NOTE — Plan of Care (Signed)
D- Patient alert and oriented. Patient presented in a pleasant mood on assessment reporting that he slept good last night and had no complaints to voice to this Clinical research associate. Patient denies SI, HI, AVH, and pain at this time. Patient also denies any signs/symptoms of depression/anxiety, stating that overall he is feeling "really good". Patient 's goal for today is "going home", in which he will "read and exercise my mind and body", in order to achieve his goal. ? ?A- Scheduled medications administered to patient, per MD orders. Support and encouragement provided.  Routine safety checks conducted every 15 minutes.  Patient informed to notify staff with problems or concerns. ? ?R- No adverse drug reactions noted. Patient contracts for safety at this time. Patient compliant with medications. Patient receptive, calm, and cooperative. Patient interacts well with others on the unit. Patient remains safe at this time. ? ?Problem: Education: ?Goal: Knowledge of [place] [facility] Education information/materials will improve ?Outcome: Progressing ?Goal: Emotional status will improve ?Outcome: Progressing ?Goal: Mental status will improve ?Outcome: Progressing ?Goal: Verbalization of understanding the information provided will improve ?Outcome: Progressing ?  ?Problem: Health Behavior/Discharge Planning: ?Goal: Identification of resources available to assist in meeting health care needs will improve ?Outcome: Progressing ?Goal: Compliance with treatment plan for underlying cause of condition will improve ?Outcome: Progressing ?  ?Problem: Education: ?Goal: Knowledge of disease or condition will improve ?Outcome: Progressing ?Goal: Understanding of discharge needs will improve ?Outcome: Progressing ?  ?Problem: Physical Regulation: ?Goal: Complications related to the disease process, condition or treatment will be avoided or minimized ?Outcome: Progressing ?  ?Problem: Safety: ?Goal: Ability to remain free from injury will  improve ?Outcome: Progressing ?  ?

## 2021-11-29 NOTE — BH IP Treatment Plan (Signed)
Interdisciplinary Treatment and Diagnostic Plan Update ? ?11/29/2021 ?Time of Session: 1000 ?Alexander Rowland ?MRN: [id number] ? ?Principal Diagnosis: Substance induced mood disorder (HCC) ? ?Secondary Diagnoses: Principal Problem: ?  Substance induced mood disorder (HCC) ?Active Problems: ?  SSRI overdose ?  Alcohol abuse ? ? ?Current Medications:  ?Current Facility-Administered Medications  ?Medication Dose Route Frequency Provider Last Rate Last Admin  ? acetaminophen (TYLENOL) tablet 650 mg  650 mg Oral Q6H PRN Clapacs, [name], MD      ? alum & mag hydroxide-simeth (MAALOX/MYLANTA) 200-200-20 MG/5ML suspension 30 mL  30 mL Oral Q4H PRN Clapacs, [name], MD      ? hydrOXYzine (ATARAX) tablet 50 mg  50 mg Oral TID PRN Clapacs, [name], MD      ? magnesium hydroxide (MILK OF MAGNESIA) suspension 30 mL  30 mL Oral Daily PRN Clapacs, [name], MD      ? ondansetron (ZOFRAN) tablet 4 mg  4 mg Oral Q8H PRN Clapacs, [name], MD      ? thiamine tablet 100 mg  100 mg Oral Daily Clapacs, [name], MD   100 mg at 11/29/21 [id number]  ? Or  ? thiamine (B-1) injection 100 mg  100 mg Intravenous Daily Clapacs, [name], MD      ? traZODone (DESYREL) tablet 100 mg  100 mg Oral QHS PRN Clapacs, [name], MD      ? ?PTA Medications: ?Medications Prior to Admission  ?Medication Sig Dispense Refill Last Dose  ? FLUoxetine (PROZAC) 20 MG capsule Take 20 mg by mouth every morning.   11/26/2021  ? ? ?Patient Stressors: Health problems   ?Substance abuse   ? ?Patient Strengths: Ability for insight  ?Average or above average intelligence  ?Communication skills  ?Physical Health  ?Supportive family/friends  ?Work skills  ? ?Treatment Modalities: Medication Management, Group therapy, Case management,  ?1 to 1 session with clinician, Psychoeducation, Recreational therapy. ? ? ?Physician Treatment Plan for Primary Diagnosis: Substance induced mood disorder (HCC) ?Long Term Goal(s): Improvement in symptoms so as ready for discharge  ? ?Short Term Goals:  Ability to identify triggers associated with substance abuse/mental health issues will improve ?Ability to disclose and discuss suicidal ideas ?Ability to demonstrate self-control will improve ? ?Medication Management: Evaluate patient's response, side effects, and tolerance of medication regimen. ? ?Therapeutic Interventions: 1 to 1 sessions, Unit Group sessions and Medication administration. ? ?Evaluation of Outcomes: Progressing ? ?Physician Treatment Plan for Secondary Diagnosis: Principal Problem: ?  Substance induced mood disorder (HCC) ?Active Problems: ?  SSRI overdose ?  Alcohol abuse ? ?Long Term Goal(s): Improvement in symptoms so as ready for discharge  ? ?Short Term Goals: Ability to identify triggers associated with substance abuse/mental health issues will improve ?Ability to disclose and discuss suicidal ideas ?Ability to demonstrate self-control will improve    ? ?Medication Management: Evaluate patient's response, side effects, and tolerance of medication regimen. ? ?Therapeutic Interventions: 1 to 1 sessions, Unit Group sessions and Medication administration. ? ?Evaluation of Outcomes: Progressing ? ? ?RN Treatment Plan for Primary Diagnosis: Substance induced mood disorder (HCC) ?Long Term Goal(s): Knowledge of disease and therapeutic regimen to maintain health will improve ? ?Short Term Goals: Ability to remain free from injury will improve, Ability to verbalize frustration and anger appropriately will improve, Ability to demonstrate self-control, Ability to participate in decision making will improve, Ability to verbalize feelings will improve, Ability to disclose and discuss suicidal ideas, Ability to identify and develop effective  coping behaviors will improve, and Compliance with prescribed medications will improve ? ?Medication Management: RN will administer medications as ordered by provider, will assess and evaluate patient's response and provide education to patient for prescribed  medication. RN will report any adverse and/or side effects to prescribing provider. ? ?Therapeutic Interventions: 1 on 1 counseling sessions, Psychoeducation, Medication administration, Evaluate responses to treatment, Monitor vital signs and CBGs as ordered, Perform/monitor CIWA, COWS, AIMS and Fall Risk screenings as ordered, Perform wound care treatments as ordered. ? ?Evaluation of Outcomes: Progressing ? ? ?LCSW Treatment Plan for Primary Diagnosis: Substance induced mood disorder (HCC) ?Long Term Goal(s): Safe transition to appropriate next level of care at discharge, Engage patient in therapeutic group addressing interpersonal concerns. ? ?Short Term Goals: Engage patient in aftercare planning with referrals and resources, Increase social support, Increase ability to appropriately verbalize feelings, Increase emotional regulation, Facilitate acceptance of mental health diagnosis and concerns, Facilitate patient progression through stages of change regarding substance use diagnoses and concerns, Identify triggers associated with mental health/substance abuse issues, and Increase skills for wellness and recovery ? ?Therapeutic Interventions: Assess for all discharge needs, 1 to 1 time with Child psychotherapist, Explore available resources and support systems, Assess for adequacy in community support network, Educate family and significant other(s) on suicide prevention, Complete Psychosocial Assessment, Interpersonal group therapy. ? ?Evaluation of Outcomes: Progressing ? ? ?Progress in Treatment: ?Attending groups: No. ?Participating in groups: No. ?Taking medication as prescribed: Yes. ?Toleration medication: Yes. ?Family/Significant other contact made: No, will contact:  Patient declined consent for CSW to reach out to collateral.  ?Patient understands diagnosis: Yes. ?Discussing patient identified problems/goals with staff: Yes. ?Medical problems stabilized or resolved: Yes. ?Denies suicidal/homicidal ideation:  No. ?Issues/concerns per patient self-inventory: Yes. ?Other: none ? ?New problem(s) identified: Yes, Describe:  Patient reports mother has terminal cancer, experiencing grief.  ? ?New Short Term/Long Term Goal(s): Patient to work towards detox, medication management for mood stabilization; elimination of SI thoughts; development of comprehensive mental wellness/sobriety plan. ? ?Patient Goals:  States, "go home . . . Be a better son."   ? ?Discharge Plan or Barriers: Patient is discharging Monday at 0930 to attend [facility] treatment.  ? ?Reason for Continuation of Hospitalization: Suicidal ideation ?Other; describe Active Substance Use ? ?Estimated Length of Stay: Monday 4/17 at 0930  ? ?Last 3 [place] Suicide Severity Risk Score: ?Flowsheet Row Admission (Current) from 11/27/2021 in [facility] INPATIENT BE[facility] MEDICINE ED from 11/26/2021 in [facility]  ?C-SSRS RISK CATEGORY Error: Q7 should not be populated when Q6 is No High Risk  ? ?  ? ? ?Last PHQ 2/9 Scores: ?   ? View : No data to display.  ?  ?  ?  ? ? ?Scribe for Treatment Team: ?Corky Crafts, LCSWA ?11/29/2021 ?10:39 AM ? ? ?

## 2021-11-29 NOTE — Group Note (Signed)
[facility] LCSW Group Therapy Note ? ? ?Group Date: 11/29/2021 ?Start Time: 1300 ?End Time: 1400 ? ?Type of Therapy and Topic:  Group Therapy:  Feelings around Relapse and Recovery ? ?Participation Level:  Did Not Attend  ? ?Mood: ? ?Description of Group:   ? Patients in this group will discuss emotions they experience before and after a relapse. They will process how experiencing these feelings, or avoidance of experiencing them, relates to having a relapse. Facilitator will guide patients to explore emotions they have related to recovery. Patients will be encouraged to process which emotions are more powerful. They will be guided to discuss the emotional reaction significant others in their lives may have to patients? relapse or recovery. Patients will be assisted in exploring ways to respond to the emotions of others without this contributing to a relapse. ? ?Therapeutic Goals: ?Patient will identify two or more emotions that lead to relapse for them:  ?Patient will identify two emotions that result when they relapse:  ?Patient will identify two emotions related to recovery:  ?Patient will demonstrate ability to communicate their needs through discussion and/or role plays. ? ? ?Summary of Patient Progress: ? ?Patient did not attend group despite encouraged participation.  ? ? ?Therapeutic Modalities:   ?Cognitive Behavioral Therapy ?Solution-Focused Therapy ?Assertiveness Training ?Relapse Prevention Therapy ? ? ?Early Ord W Caiden Arteaga, LCSWA ?

## 2021-11-30 NOTE — Progress Notes (Signed)
Patient alert and oriented x 4, affect is blunted thoughts are organized, he denies SI/HI/AVH interacting appropriately with peers and staff. 15 minutes safety checks maintained .  ?

## 2021-11-30 NOTE — Group Note (Signed)
[facility] LCSW Group Therapy Note ? ? ?Group Date: 11/30/2021 ?Start Time: 1300 ?End Time: 1400 ? ? ?Type of Therapy and Topic: Group Therapy: Avoiding Self-Sabotaging and Enabling Behaviors ? ?Participation Level: Did Not Attend ? ?Mood: ? ?Description of Group:  ?In this group, patients will learn how to identify obstacles, self-sabotaging and enabling behaviors, as well as: what are they, why do we do them and what needs these behaviors meet. Discuss unhealthy relationships and how to have positive healthy boundaries with those that sabotage and enable. Explore aspects of self-sabotage and enabling in yourself and how to limit these self-destructive behaviors in everyday life. ? ? ?Therapeutic Goals: ?1. Patient will identify one obstacle that relates to self-sabotage and enabling behaviors ?2. Patient will identify one personal self-sabotaging or enabling behavior they did prior to admission ?3. Patient will state a plan to change the above identified behavior ?4. Patient will demonstrate ability to communicate their needs through discussion and/or role play.  ? ? ?Summary of Patient Progress: Patient did not attend group despite encouraged participation. ? ? ? ?Therapeutic Modalities:  ?Cognitive Behavioral Therapy ?Person-Centered Therapy ?Motivational Interviewing ? ? ? ?Oaklie Durrett K Milisa Kimbell, LCSWA ?

## 2021-11-30 NOTE — Progress Notes (Signed)
[facility] MD Progress Note ? ?11/30/2021 11:55 AM ?Alexander Rowland  ?MRN:  [id number] ?Subjective: Patient seen for follow-up.  No new complaints.  Reports mood is feeling fine.  Denies depression or suicidal ideation.  No evidence or report of any alcohol withdrawal symptoms ?Principal Problem: Substance induced mood disorder ([place]) ?Diagnosis: Principal Problem: ?  Substance induced mood disorder ([place]) ?Active Problems: ?  SSRI overdose ?  Alcohol abuse ? ?Total Time spent with patient: 30 minutes ? ?Past Psychiatric History: Past history of ongoing alcohol abuse with clear mood problems when he is intoxicated.  I think what may escape the patient's attention is the clear evidence we have that when he is intoxicated he does express suicidal ideation. ? ?Past Medical History:  ?Past Medical History:  ?Diagnosis Date  ? Alcoholism ([place])   ? Depression   ? History reviewed. No pertinent surgical history. ?Family History:  ?Family History  ?Problem Relation Age of Onset  ? Alcoholism Father   ? ?Family Psychiatric  History: See previous. ?Social History:  ?Social History  ? ?Substance and Sexual Activity  ?Alcohol Use Yes  ?   ?Social History  ? ?Substance and Sexual Activity  ?Drug Use Not on file  ?  ?Social History  ? ?Socioeconomic History  ? Marital status: Single  ?  Spouse name: Not on file  ? Number of children: Not on file  ? Years of education: Not on file  ? Highest education level: Not on file  ?Occupational History  ? Not on file  ?Tobacco Use  ? Smoking status: Never  ? Smokeless tobacco: Never  ?Vaping Use  ? Vaping Use: Some days  ?Substance and Sexual Activity  ? Alcohol use: Yes  ? Drug use: Not on file  ? Sexual activity: Not on file  ?Other Topics Concern  ? Not on file  ?Social History Narrative  ? Not on file  ? ?Social Determinants of Health  ? ?Financial Resource Strain: Not on file  ?Food Insecurity: Not on file  ?Transportation Needs: Not on file  ?Physical Activity: Not on file  ?Stress: Not on  file  ?Social Connections: Not on file  ? ?Additional Social History:  ?  ?  ?  ?  ?  ?  ?  ?  ?  ?  ?  ? ?Sleep: Fair ? ?Appetite:  Fair ? ?Current Medications: ?Current Facility-Administered Medications  ?Medication Dose Route Frequency Provider Last Rate Last Admin  ? acetaminophen (TYLENOL) tablet 650 mg  650 mg Oral Q6H PRN Kolter Reaver, Madie Reno, MD      ? alum & mag hydroxide-simeth (MAALOX/MYLANTA) 200-200-20 MG/5ML suspension 30 mL  30 mL Oral Q4H PRN Dalyah Pla, Madie Reno, MD      ? hydrOXYzine (ATARAX) tablet 50 mg  50 mg Oral TID PRN Fernanda Twaddell, Madie Reno, MD      ? magnesium hydroxide (MILK OF MAGNESIA) suspension 30 mL  30 mL Oral Daily PRN Latysha Thackston T, MD      ? ondansetron (ZOFRAN) tablet 4 mg  4 mg Oral Q8H PRN Lynne Righi T, MD      ? thiamine tablet 100 mg  100 mg Oral Daily Mylisa Brunson, Madie Reno, MD   100 mg at 11/30/21 0756  ? Or  ? thiamine (B-1) injection 100 mg  100 mg Intravenous Daily Kassady Laboy T, MD      ? traZODone (DESYREL) tablet 100 mg  100 mg Oral QHS PRN Spiros Greenfeld, Madie Reno, MD   100  mg at 11/30/21 0039  ? ? ?Lab Results: No results found for this or any previous visit (from the past 48 hour(s)). ? ?Blood Alcohol level:  ?Lab Results  ?Component Value Date  ? ETH 287 (H) 11/26/2021  ? ? ?Metabolic Disorder Labs: ?No results found for: HGBA1C, MPG ?No results found for: PROLACTIN ?No results found for: CHOL, TRIG, HDL, CHOLHDL, VLDL, LDLCALC ? ?Physical Findings: ?AIMS:  , ,  ,  ,    ?CIWA:  CIWA-Ar Total: 0 ?COWS:    ? ?Musculoskeletal: ?Strength & Muscle Tone: within normal limits ?Gait & Station: normal ?Patient leans: N/A ? ?Psychiatric Specialty Exam: ? ?Presentation  ?General Appearance: Casual; Fairly Groomed ? ?Eye Contact:Good ? ?Speech:Clear and Coherent; Normal Rate ? ?Speech Volume:Normal ? ?Handedness:Right ? ? ?Mood and Affect  ?Mood:Depressed; Anxious ? ?Affect:Congruent ? ? ?Thought Process  ?Thought Processes:Coherent; Goal Directed ? ?Descriptions of  Associations:Intact ? ?Orientation:Full (Time, Place and Person) ? ?Thought Content:Logical ? ?History of Schizophrenia/Schizoaffective disorder:No data recorded ?Duration of Psychotic Symptoms:No data recorded ?Hallucinations:No data recorded ?Ideas of Reference:None ? ?Suicidal Thoughts:No data recorded ?Homicidal Thoughts:No data recorded ? ?Sensorium  ?Memory:Immediate Good; Recent Good; Remote Good ? ?Judgment:Fair ? ?Insight:Good ? ? ?Executive Functions  ?Concentration:Good ? ?Attention Span:Good ? ?Recall:Good ? ?Fund of Welcome ? ?Language:Good ? ? ?Psychomotor Activity  ?Psychomotor Activity:No data recorded ? ?Assets  ?Assets:Communication Skills; Desire for Improvement; Housing; Physical Health ? ? ?Sleep  ?Sleep:No data recorded ? ? ?Physical Exam: ?Physical Exam ?Vitals and nursing note reviewed.  ?Constitutional:   ?   Appearance: Normal appearance.  ?HENT:  ?   Head: Normocephalic and atraumatic.  ?   Mouth/Throat:  ?   Pharynx: Oropharynx is clear.  ?Eyes:  ?   Pupils: Pupils are equal, round, and reactive to light.  ?Cardiovascular:  ?   Rate and Rhythm: Normal rate and regular rhythm.  ?Pulmonary:  ?   Effort: Pulmonary effort is normal.  ?   Breath sounds: Normal breath sounds.  ?Abdominal:  ?   General: Abdomen is flat.  ?   Palpations: Abdomen is soft.  ?Musculoskeletal:     ?   General: Normal range of motion.  ?Skin: ?   General: Skin is warm and dry.  ?Neurological:  ?   General: No focal deficit present.  ?   Mental Status: He is alert. Mental status is at baseline.  ?Psychiatric:     ?   Attention and Perception: Attention normal.     ?   Mood and Affect: Mood normal.     ?   Speech: Speech normal.     ?   Behavior: Behavior normal.     ?   Thought Content: Thought content normal.     ?   Cognition and Memory: Cognition normal.  ? ?Review of Systems  ?Constitutional: Negative.   ?HENT: Negative.    ?Eyes: Negative.   ?Respiratory: Negative.    ?Cardiovascular: Negative.    ?Gastrointestinal: Negative.   ?Musculoskeletal: Negative.   ?Skin: Negative.   ?Neurological: Negative.   ?Psychiatric/Behavioral: Negative.    ?Blood pressure 103/69, pulse (!) 111, temperature 98.1 ?F (36.7 ?C), resp. rate 18, height 5\' 11"  (1.803 m), weight 110.2 kg, SpO2 98 %. Body mass index is 33.89 kg/m?. ? ? ?Treatment Plan Summary: ?Medication management and Plan no change to current medication.  Plan remains for transfer to [facility] rehabilitation program on Monday.  Encourage group attendance and interaction with staff and letting us know of  any concerns prior to that.  Although he is currently denying symptoms the treatment team's consensus is that it is much the safer plan to try and transfer him directly to rehab given the recent problems he had with checking out early and then drinking ? ?Alethia Berthold, MD ?11/30/2021, 11:55 AM ? ?

## 2021-11-30 NOTE — Plan of Care (Signed)
Pt was educated on care plan and verbalizes understanding. Pt has no complaints and denies AVH. Torrie Mayers RN ?Problem: Education: ?Goal: Knowledge of [facility] Education information/materials will improve ?Outcome: Progressing ?Goal: Emotional status will improve ?Outcome: Progressing ?Goal: Mental status will improve ?Outcome: Progressing ?Goal: Verbalization of understanding the information provided will improve ?Outcome: Progressing ?  ?Problem: Health Behavior/Discharge Planning: ?Goal: Identification of resources available to assist in meeting health care needs will improve ?Outcome: Progressing ?Goal: Compliance with treatment plan for underlying cause of condition will improve ?Outcome: Progressing ?  ?Problem: Education: ?Goal: Knowledge of disease or condition will improve ?Outcome: Progressing ?Goal: Understanding of discharge needs will improve ?Outcome: Progressing ?  ?Problem: Physical Regulation: ?Goal: Complications related to the disease process, condition or treatment will be avoided or minimized ?Outcome: Progressing ?  ?Problem: Safety: ?Goal: Ability to remain free from injury will improve ?Outcome: Progressing ?  ?

## 2021-11-30 NOTE — Progress Notes (Signed)
Pt has been calm and cooperative. Pt did not eat dinner tonight. Pt did not attend groups. Pt denies any depression and anxiety. Collier Bullock RN ?

## 2021-12-01 MED ORDER — TRAZODONE HCL 100 MG PO TABS: 100.0000 mg | ORAL_TABLET | Freq: Every evening | ORAL | 1 refills | Status: AC | PRN

## 2021-12-01 NOTE — Plan of Care (Signed)
Pt denies depression, anxiety, SI, HI and AVH. Pt was educated on care plan and verbalizes understanding. Collier Bullock RN ?Problem: Education: ?Goal: Knowledge of [facility] Education information/materials will improve ?Outcome: Progressing ?Goal: Emotional status will improve ?Outcome: Progressing ?Goal: Mental status will improve ?Outcome: Progressing ?Goal: Verbalization of understanding the information provided will improve ?Outcome: Progressing ?  ?Problem: Health Behavior/Discharge Planning: ?Goal: Identification of resources available to assist in meeting health care needs will improve ?Outcome: Progressing ?Goal: Compliance with treatment plan for underlying cause of condition will improve ?Outcome: Progressing ?  ?Problem: Education: ?Goal: Knowledge of disease or condition will improve ?Outcome: Progressing ?Goal: Understanding of discharge needs will improve ?Outcome: Progressing ?  ?Problem: Physical Regulation: ?Goal: Complications related to the disease process, condition or treatment will be avoided or minimized ?Outcome: Progressing ?  ?Problem: Safety: ?Goal: Ability to remain free from injury will improve ?Outcome: Progressing ?  ?

## 2021-12-01 NOTE — [facility] Suicide Risk Assessment (Signed)
[facility] Discharge Suicide Risk Assessment ? ? ?Principal Problem: Substance induced mood disorder ([facility]) ?Discharge Diagnoses: Principal Problem: ?  Substance induced mood disorder ([facility]) ?Active Problems: ?  SSRI overdose ?  Alcohol abuse ? ? ?Total Time spent with patient: 30 minutes ? ?Musculoskeletal: ?Strength & Muscle Tone: within normal limits ?Gait & Station: normal ?Patient leans: N/A ? ?Psychiatric Specialty Exam ? ?Presentation  ?General Appearance: Casual; Fairly Groomed ? ?Eye Contact:Good ? ?Speech:Clear and Coherent; Normal Rate ? ?Speech Volume:Normal ? ?Handedness:Right ? ? ?Mood and Affect  ?Mood:Depressed; Anxious ? ?Duration of Depression Symptoms: No data recorded ?Affect:Congruent ? ? ?Thought Process  ?Thought Processes:Coherent; Goal Directed ? ?Descriptions of Associations:Intact ? ?Orientation:Full (Time, Place and Person) ? ?Thought Content:Logical ? ?History of Schizophrenia/Schizoaffective disorder:No data recorded ?Duration of Psychotic Symptoms:No data recorded ?Hallucinations:No data recorded ?Ideas of Reference:None ? ?Suicidal Thoughts:No data recorded ?Homicidal Thoughts:No data recorded ? ?Sensorium  ?Memory:Immediate Good; Recent Good; Remote Good ? ?Judgment:Fair ? ?Insight:Good ? ? ?Executive Functions  ?Concentration:Good ? ?Attention Span:Good ? ?Recall:Good ? ?Fund of Knowledge:Good ? ?Language:Good ? ? ?Psychomotor Activity  ?Psychomotor Activity:No data recorded ? ?Assets  ?Assets:Communication Skills; Desire for Improvement; Housing; Physical Health ? ? ?Sleep  ?Sleep:No data recorded ? ?Physical Exam: ?Physical Exam ?Vitals and nursing note reviewed.  ?Constitutional:   ?   Appearance: Normal appearance.  ?HENT:  ?   Head: Normocephalic and atraumatic.  ?   Mouth/Throat:  ?   Pharynx: Oropharynx is clear.  ?Eyes:  ?   Pupils: Pupils are equal, round, and reactive to light.  ?Cardiovascular:  ?   Rate and Rhythm: Normal rate and regular rhythm.  ?Pulmonary:  ?   Effort:  Pulmonary effort is normal.  ?   Breath sounds: Normal breath sounds.  ?Abdominal:  ?   General: Abdomen is flat.  ?   Palpations: Abdomen is soft.  ?Musculoskeletal:     ?   General: Normal range of motion.  ?Skin: ?   General: Skin is warm and dry.  ?Neurological:  ?   General: No focal deficit present.  ?   Mental Status: He is alert. Mental status is at baseline.  ?Psychiatric:     ?   Mood and Affect: Mood normal.     ?   Thought Content: Thought content normal.  ? ?Review of Systems  ?Constitutional: Negative.   ?HENT: Negative.    ?Eyes: Negative.   ?Respiratory: Negative.    ?Cardiovascular: Negative.   ?Gastrointestinal: Negative.   ?Musculoskeletal: Negative.   ?Skin: Negative.   ?Neurological: Negative.   ?Psychiatric/Behavioral: Negative.    ?Blood pressure (!) 108/94, pulse (!) 108, temperature 97.7 ?F (36.5 ?C), temperature source Oral, resp. rate 18, height 5\' 11"  (1.803 m), weight 110.2 kg, SpO2 98 %. Body mass index is 33.89 kg/m?. ? ?Mental Status Per Nursing Assessment::   ?On Admission:  NA ? ?Demographic Factors:  ?Male and Caucasian ? ?Loss Factors: ?Decline in physical health and Financial problems/change in socioeconomic status ? ?Historical Factors: ?Impulsivity ? ?Risk Reduction Factors:   ?Sense of responsibility to family, Positive social support, and Positive therapeutic relationship ? ?Continued Clinical Symptoms:  ?Depression:   Impulsivity ?Alcohol/Substance Abuse/Dependencies ? ?Cognitive Features That Contribute To Risk:  ?None   ? ?Suicide Risk:  ?Minimal: No identifiable suicidal ideation.  Patients presenting with no risk factors but with morbid ruminations; may be classified as minimal risk based on the severity of the depressive symptoms ? ? Follow-up Information   ? ? Services,  [facility]. Go on 12/02/2021.   ?Why: Please present for scheduled appointment on 10/04/2021 at 0900AM. ?Contact information: ?[address] ?[place] [address] ?[phone] ? ? ?  ?  ? ?  ?   ? ?  ? ? ?Plan Of Care/Follow-up recommendations:  ?Patient will be transported to [facility] recovery services tomorrow morning by 9:00.  Prescriptions will be provided and so will a 14-day supply.  Patient has been strongly encouraged to continue plan of staying at recovery program.  Denies any suicidal ideation.  Has shown no dangerous behavior in the hospital. ? ?Mordecai Rasmussen, MD ?12/01/2021, 11:27 AM ?

## 2021-12-01 NOTE — Group Note (Signed)
LCSW Group Therapy Note ? ?Group Date: 12/01/2021 ?Start Time: 1310 ?End Time: 1400 ? ? ?Type of Therapy and Topic:  Group Therapy - Healthy vs Unhealthy Coping Skills ? ?Participation Level:  Active  ? ?Description of Group ?The focus of this group was to determine what unhealthy coping techniques typically are used by group members and what healthy coping techniques would be helpful in coping with various problems. Patients were guided in becoming aware of the differences between healthy and unhealthy coping techniques. Patients were asked to identify 2-3 healthy coping skills they would like to learn to use more effectively. ? ?Therapeutic Goals ?Patients learned that coping is what human beings do all day long to deal with various situations in their lives ?Patients defined and discussed healthy vs unhealthy coping techniques ?Patients identified their preferred coping techniques and identified whether these were healthy or unhealthy ?Patients determined 2-3 healthy coping skills they would like to become more familiar with and use more often. ?Patients provided support and ideas to each other ? ? ?Summary of Patient Progress: Patient presented on time to group and participated in opening and closing remarks. Patient engaged in discussion of the topic, identifying drinking alcohol as an unhealthy coping skill he has engaged in recently. Patient shared that he is feeling ready to leave the hospital and stated that he will be going to [facility] tomorrow. Patient identified ruminating on his thoughts and rationalizing past behavior as problematic coping techniques. Patient identified gratitude and 12-step recovery as healthy coping skills.  ? ? ?Therapeutic Modalities ?Cognitive Behavioral Therapy ?Motivational Interviewing ? ?Kenna Gilbert Dorothie Wah, LCSWA ?12/01/2021  3:05 PM   ?

## 2021-12-01 NOTE — Progress Notes (Signed)
Pt denies depression, anxiety, SI, HI and AVH. Pt attended group today. Pt seems to be in a positive mood regarding discharge. Collier Bullock RN ?

## 2021-12-01 NOTE — Discharge Summary (Signed)
Physician Discharge Summary Note ? ?Patient:  Alexander Rowland is an 36 y.o., male ?MRN:  [id number] ?DOB:  1986/01/01 ?Patient phone:  [phone] (home)  ?Patient address:   ?[address] ?[place] [address],  ?Total Time spent with patient: 30 minutes ? ?Date of Admission:  11/27/2021 ?Date of Discharge: 12/01/2021 ? ?Reason for Admission: Admitted to the hospital because of suicidal ideation statements made while intoxicated.  Patient had taken an overdose of fluoxetine while drunk ? ?Principal Problem: Substance induced mood disorder ([place]) ?Discharge Diagnoses: Principal Problem: ?  Substance induced mood disorder ([place]) ?Active Problems: ?  SSRI overdose ?  Alcohol abuse ? ? ?Past Psychiatric History: Past history of alcohol abuse with recurrent relapses recent failure to follow up with treatment history of depression and suicidal statements while intoxicated ? ?Past Medical History:  ?Past Medical History:  ?Diagnosis Date  ? Alcoholism ([place])   ? Depression   ? History reviewed. No pertinent surgical history. ?Family History:  ?Family History  ?Problem Relation Age of Onset  ? Alcoholism Father   ? ?Family Psychiatric  History: Unclear.  Report of alcoholism in father but patient does not report this to me personally ?Social History:  ?Social History  ? ?Substance and Sexual Activity  ?Alcohol Use Yes  ?   ?Social History  ? ?Substance and Sexual Activity  ?Drug Use Not on file  ?  ?Social History  ? ?Socioeconomic History  ? Marital status: Single  ?  Spouse name: Not on file  ? Number of children: Not on file  ? Years of education: Not on file  ? Highest education level: Not on file  ?Occupational History  ? Not on file  ?Tobacco Use  ? Smoking status: Never  ? Smokeless tobacco: Never  ?Vaping Use  ? Vaping Use: Some days  ?Substance and Sexual Activity  ? Alcohol use: Yes  ? Drug use: Not on file  ? Sexual activity: Not on file  ?Other Topics Concern  ? Not on file  ?Social History Narrative   ? Not on file  ? ?Social Determinants of Health  ? ?Financial Resource Strain: Not on file  ?Food Insecurity: Not on file  ?Transportation Needs: Not on file  ?Physical Activity: Not on file  ?Stress: Not on file  ?Social Connections: Not on file  ? ? ?Hospital Course: Admitted to psychiatric unit.  Detox orders in place but the patient required no medical detox.  No seizures no DTs no shakiness.  Mood improved and he consistently denied suicidal ideation.  Patient was requesting discharge on the grounds that it was his mother's birthday.  It was pointed out to him by treatment team that he had just left detox facilities avoiding going to rehab for the same reason and had wound up drinking heavily and taking an overdose.  Under these circumstances it seems to be poor judgment to be discharged early.  Patient remained in the hospital through the weekend.  Plan is for discharge to [facility] for recovery services tomorrow.  Did not restart Prozac as he had taken a large overdose of it and it was not clear that when he is sober he needs medicine for depression. ? ?Physical Findings: ?AIMS:  , ,  ,  ,    ?CIWA:  CIWA-Ar Total: 0 ?COWS:    ? ?Musculoskeletal: ?Strength & Muscle Tone: within normal limits ?Gait & Station: normal ?Patient leans: N/A ? ? ?Psychiatric Specialty Exam: ? ?Presentation  ?General Appearance: Casual; Fairly Groomed ? ?  Eye Contact:Good ? ?Speech:Clear and Coherent; Normal Rate ? ?Speech Volume:Normal ? ?Handedness:Right ? ? ?Mood and Affect  ?Mood:Depressed; Anxious ? ?Affect:Congruent ? ? ?Thought Process  ?Thought Processes:Coherent; Goal Directed ? ?Descriptions of Associations:Intact ? ?Orientation:Full (Time, Place and Person) ? ?Thought Content:Logical ? ?History of Schizophrenia/Schizoaffective disorder:No data recorded ?Duration of Psychotic Symptoms:No data recorded ?Hallucinations:No data recorded ?Ideas of Reference:None ? ?Suicidal Thoughts:No data recorded ?Homicidal Thoughts:No data  recorded ? ?Sensorium  ?Memory:Immediate Good; Recent Good; Remote Good ? ?Judgment:Fair ? ?Insight:Good ? ? ?Executive Functions  ?Concentration:Good ? ?Attention Span:Good ? ?Recall:Good ? ?Fund of [place] ? ?Language:Good ? ? ?Psychomotor Activity  ?Psychomotor Activity:No data recorded ? ?Assets  ?Assets:Communication Skills; Desire for Improvement; Housing; Physical Health ? ? ?Sleep  ?Sleep:No data recorded ? ? ?Physical Exam: ?Physical Exam ?Vitals reviewed.  ?Constitutional:   ?   Appearance: Normal appearance.  ?HENT:  ?   Head: Normocephalic and atraumatic.  ?   Mouth/Throat:  ?   Pharynx: Oropharynx is clear.  ?Eyes:  ?   Pupils: Pupils are equal, round, and reactive to light.  ?Cardiovascular:  ?   Rate and Rhythm: Normal rate and regular rhythm.  ?Pulmonary:  ?   Effort: Pulmonary effort is normal.  ?   Breath sounds: Normal breath sounds.  ?Abdominal:  ?   General: Abdomen is flat.  ?   Palpations: Abdomen is soft.  ?Musculoskeletal:     ?   General: Normal range of motion.  ?Skin: ?   General: Skin is warm and dry.  ?Neurological:  ?   General: No focal deficit present.  ?   Mental Status: He is alert. Mental status is at baseline.  ?Psychiatric:     ?   Attention and Perception: Attention normal.     ?   Mood and Affect: Mood normal.     ?   Speech: Speech normal.     ?   Behavior: Behavior is cooperative.     ?   Thought Content: Thought content normal.     ?   Cognition and Memory: Cognition normal.  ? ?Review of Systems  ?Constitutional: Negative.   ?HENT: Negative.    ?Eyes: Negative.   ?Respiratory: Negative.    ?Cardiovascular: Negative.   ?Gastrointestinal: Negative.   ?Musculoskeletal: Negative.   ?Skin: Negative.   ?Neurological: Negative.   ?Psychiatric/Behavioral: Negative.    ?Blood pressure (!) 108/94, pulse (!) 108, temperature 97.7 ?F (36.5 ?C), temperature source Oral, resp. rate 18, height 5\' 11"  (1.803 m), weight 110.2 kg, SpO2 98 %. Body mass index is 33.89  kg/m?. ? ? ?Social History  ? ?Tobacco Use  ?Smoking Status Never  ?Smokeless Tobacco Never  ? ?Tobacco Cessation:  N/A, patient does not currently use tobacco products ? ? ?Blood Alcohol level:  ?Lab Results  ?Component Value Date  ? ETH 287 (H) 11/26/2021  ? ? ?Metabolic Disorder Labs:  ?No results found for: HGBA1C, MPG ?No results found for: PROLACTIN ?No results found for: CHOL, TRIG, HDL, CHOLHDL, VLDL, LDLCALC ? ?See Psychiatric Specialty Exam and Suicide Risk Assessment completed by Attending Physician prior to discharge. ? ?Discharge destination:  [facility] ? ?Is patient on multiple antipsychotic therapies at discharge:  No   ?Has Patient had three or more failed trials of antipsychotic monotherapy by history:  No ? ?Recommended Plan for Multiple Antipsychotic Therapies: ?NA ? ?Discharge Instructions   ? ? Diet - low sodium heart healthy   Complete by: As directed ?  ?  Increase activity slowly   Complete by: As directed ?  ? ?  ? ?Allergies as of 12/01/2021   ? ?   Reactions  ? Asa [aspirin] Anaphylaxis  ? ?  ? ?  ?Medication List  ?  ? ?STOP taking these medications   ? ?FLUoxetine 20 MG capsule ?Commonly known as: PROZAC ?  ? ?  ? ?TAKE these medications   ? ?  Indication  ?traZODone 100 MG tablet ?Commonly known as: DESYREL ?Take 1 tablet (100 mg total) by mouth at bedtime as needed for sleep. ? Indication: Trouble Sleeping ?  ? ?  ? ? Follow-up Information   ? ? Services, [facility]. Go on 12/02/2021.   ?Why: Please present for scheduled appointment on 10/04/2021 at 0900AM. ?Contact information: ?[address] ?[place] [address] ?[phone] ? ? ?  ?  ? ?  ?  ? ?  ? ? ?Follow-up recommendations: Follow-up with [facility] residential.  Only medicine at this point really is the trazodone as needed for sleep.  Continue focus on staying sober. ? ?Comments: 14-day supply provided along with prescriptions ? ?Signed: ?Alethia Berthold, MD ?12/01/2021, 11:31 AM ? ? ? ? ? ? ? ?

## 2021-12-01 NOTE — Progress Notes (Signed)
?  [facility] Adult Case Management Discharge Plan : ? ?Will you be returning to the same living situation after discharge:  No.Patient to be admitted to [place] for inpatient substance use treatment. ?At discharge, do you have transportation home?: No.CSW to provide taxi transportation. ?Do you have the ability to pay for your medications: No.14-day supply provided at discharge. ? ?Release of information consent forms completed and in the chart;  Patient's signature needed at discharge. ? ?Patient to Follow up at: ? Follow-up Information   ? ? Services, [facility]. Go on 12/02/2021.   ?Why: Please present for scheduled appointment on 10/04/2021 at 0900AM. ?Contact information: ?[address] ?[place] [address] ?[phone] ? ? ?  ?  ? ?  ?  ? ?  ? ? ?Next level of care provider has access to [facility] ? ?Safety Planning and Suicide Prevention discussed: Yes,  Patient refused collateral contact, SPE completed with patient. ? ?Have you used any form of tobacco in the last 30 days? (Cigarettes, Smokeless Tobacco, Cigars, and/or Pipes): No ? ?Has patient been referred to the Quitline?: Patient refused referral ?Tobacco Use: Low Risk   ? Smoking Tobacco Use: Never  ? Smokeless Tobacco Use: Never  ? Passive Exposure: Not on file  ? ? ?Patient has been referred for addiction treatment: Yes [facility] ? ?Kenna Gilbert Peaches Vanoverbeke, LCSWA ?12/01/2021, 4:52 PM ? ?

## 2021-12-01 NOTE — Progress Notes (Signed)
[facility] MD Progress Note ? ?12/01/2021 11:25 AM ?Alexander Rowland  ?MRN:  [id number] ?Subjective: Patient seen and chart reviewed.  The patient has no new complaints.  Somewhat dysphoric and irritable about not going to see his mother but cooperative with plan to continue to [facility] residential.  Tolerating medicine well.  Denies suicidal or psychotic thinking.  Physically stable. ?Principal Problem: Substance induced mood disorder ([facility]) ?Diagnosis: Principal Problem: ?  Substance induced mood disorder ([facility]) ?Active Problems: ?  SSRI overdose ?  Alcohol abuse ? ?Total Time spent with patient: 30 minutes ? ?Past Psychiatric History: Past history of severe alcohol abuse ? ?Past Medical History:  ?Past Medical History:  ?Diagnosis Date  ? Alcoholism ([facility])   ? Depression   ? History reviewed. No pertinent surgical history. ?Family History:  ?Family History  ?Problem Relation Age of Onset  ? Alcoholism Father   ? ?Family Psychiatric  History: See previous ?Social History:  ?Social History  ? ?Substance and Sexual Activity  ?Alcohol Use Yes  ?   ?Social History  ? ?Substance and Sexual Activity  ?Drug Use Not on file  ?  ?Social History  ? ?Socioeconomic History  ? Marital status: Single  ?  Spouse name: Not on file  ? Number of children: Not on file  ? Years of education: Not on file  ? Highest education level: Not on file  ?Occupational History  ? Not on file  ?Tobacco Use  ? Smoking status: Never  ? Smokeless tobacco: Never  ?Vaping Use  ? Vaping Use: Some days  ?Substance and Sexual Activity  ? Alcohol use: Yes  ? Drug use: Not on file  ? Sexual activity: Not on file  ?Other Topics Concern  ? Not on file  ?Social History Narrative  ? Not on file  ? ?Social Determinants of Health  ? ?Financial Resource Strain: Not on file  ?Food Insecurity: Not on file  ?Transportation Needs: Not on file  ?Physical Activity: Not on file  ?Stress: Not on file  ?Social Connections: Not on file  ? ?Additional Social History:  ?  ?  ?  ?  ?   ?  ?  ?  ?  ?  ?  ? ?Sleep: Fair ? ?Appetite:  Fair ? ?Current Medications: ?Current Facility-Administered Medications  ?Medication Dose Route Frequency Provider Last Rate Last Admin  ? acetaminophen (TYLENOL) tablet 650 mg  650 mg Oral Q6H PRN Romana Deaton, [name], MD      ? alum & mag hydroxide-simeth (MAALOX/MYLANTA) 200-200-20 MG/5ML suspension 30 mL  30 mL Oral Q4H PRN Kristapher [place], [name], MD      ? hydrOXYzine (ATARAX) tablet 50 mg  50 mg Oral TID PRN Hubbard [place], [name], MD      ? magnesium hydroxide (MILK OF MAGNESIA) suspension 30 mL  30 mL Oral Daily PRN Farra Nikolic T, MD      ? ondansetron (ZOFRAN) tablet 4 mg  4 mg Oral Q8H PRN Oleva Koo T, MD      ? thiamine tablet 100 mg  100 mg Oral Daily Jalisa Sacco, [name], MD   100 mg at 12/01/21 0758  ? Or  ? thiamine (B-1) injection 100 mg  100 mg Intravenous Daily Jonnie Kubly T, MD      ? traZODone (DESYREL) tablet 100 mg  100 mg Oral QHS PRN Muslima Toppins, [name], MD   100 mg at 11/30/21 6378  ? ? ?Lab Results: No results found for this or any previous  visit (from the past 48 hour(s)). ? ?Blood Alcohol level:  ?Lab Results  ?Component Value Date  ? ETH 287 (H) 11/26/2021  ? ? ?Metabolic Disorder Labs: ?No results found for: HGBA1C, MPG ?No results found for: PROLACTIN ?No results found for: CHOL, TRIG, HDL, CHOLHDL, VLDL, LDLCALC ? ?Physical Findings: ?AIMS:  , ,  ,  ,    ?CIWA:  CIWA-Ar Total: 0 ?COWS:    ? ?Musculoskeletal: ?Strength & Muscle Tone: within normal limits ?Gait & Station: normal ?Patient leans: N/A ? ?Psychiatric Specialty Exam: ? ?Presentation  ?General Appearance: Casual; Fairly Groomed ? ?Eye Contact:Good ? ?Speech:Clear and Coherent; Normal Rate ? ?Speech Volume:Normal ? ?Handedness:Right ? ? ?Mood and Affect  ?Mood:Depressed; Anxious ? ?Affect:Congruent ? ? ?Thought Process  ?Thought Processes:Coherent; Goal Directed ? ?Descriptions of Associations:Intact ? ?Orientation:Full (Time, Place and Person) ? ?Thought Content:Logical ? ?History of  Schizophrenia/Schizoaffective disorder:No data recorded ?Duration of Psychotic Symptoms:No data recorded ?Hallucinations:No data recorded ?Ideas of Reference:None ? ?Suicidal Thoughts:No data recorded ?Homicidal Thoughts:No data recorded ? ?Sensorium  ?Memory:Immediate Good; Recent Good; Remote Good ? ?Judgment:Fair ? ?Insight:Good ? ? ?Executive Functions  ?Concentration:Good ? ?Attention Span:Good ? ?Recall:Good ? ?Fund of Knowledge:Good ? ?Language:Good ? ? ?Psychomotor Activity  ?Psychomotor Activity:No data recorded ? ?Assets  ?Assets:Communication Skills; Desire for Improvement; Housing; Physical Health ? ? ?Sleep  ?Sleep:No data recorded ? ? ?Physical Exam: ?Physical Exam ?Vitals and nursing note reviewed.  ?Constitutional:   ?   Appearance: Normal appearance.  ?HENT:  ?   Head: Normocephalic and atraumatic.  ?   Mouth/Throat:  ?   Pharynx: Oropharynx is clear.  ?Eyes:  ?   Pupils: Pupils are equal, round, and reactive to light.  ?Cardiovascular:  ?   Rate and Rhythm: Normal rate and regular rhythm.  ?Pulmonary:  ?   Effort: Pulmonary effort is normal.  ?   Breath sounds: Normal breath sounds.  ?Abdominal:  ?   General: Abdomen is flat.  ?   Palpations: Abdomen is soft.  ?Musculoskeletal:     ?   General: Normal range of motion.  ?Skin: ?   General: Skin is warm and dry.  ?Neurological:  ?   General: No focal deficit present.  ?   Mental Status: He is alert. Mental status is at baseline.  ?Psychiatric:     ?   Attention and Perception: Attention normal.     ?   Mood and Affect: Mood normal. Affect is blunt.     ?   Speech: Speech normal.     ?   Behavior: Behavior is cooperative.     ?   Thought Content: Thought content normal.     ?   Cognition and Memory: Cognition normal.  ? ?Review of Systems  ?Constitutional: Negative.   ?HENT: Negative.    ?Eyes: Negative.   ?Respiratory: Negative.    ?Cardiovascular: Negative.   ?Gastrointestinal: Negative.   ?Musculoskeletal: Negative.   ?Skin: Negative.    ?Neurological: Negative.   ?Psychiatric/Behavioral: Negative.    ?Blood pressure (!) 108/94, pulse (!) 108, temperature 97.7 ?F (36.5 ?C), temperature source Oral, resp. rate 18, height 5\' 11"  (1.803 m), weight 110.2 kg, SpO2 98 %. Body mass index is 33.89 kg/m?. ? ? ?Treatment Plan Summary: ?Daily contact with patient to assess and evaluate symptoms and progress in treatment, Medication management, and Plan no change to medication management.  Reviewed chart.  Reviewed plan with treatment team.  [facility] by  9:00.  Supply of medicine and prescriptions will be provided ? ?Mordecai Rasmussen, MD ?12/01/2021, 11:25 AM ? ?

## 2021-12-02 NOTE — Progress Notes (Signed)
Patient pleasant aand cooperative on approach. Denies SI,HI and AVH. Verbalized understanding discharge instructions and follow up care. 7 days medicines given to patient. All belongings returned from [employer]. Patient escorted out by staff and transported by cab. ?

## 2021-12-02 NOTE — Progress Notes (Signed)
Patient is scheduled for discharge this am, he is aware no distress noted. Emotional support was offered will continue to monitor .  ?

## 2021-12-02 NOTE — Progress Notes (Signed)
Patient alert and oriented x 4, affect is blunted thoughts are organized, he denies SI/HI/AVH but appears anxious and apprehensive he paced the unit for a while during the shift and eventually stopped before going to bed. Patient was offered emotional support and encouragement, 15 minutes safety checks maintained, will continue to closely monitor.  ?

## 2024-04-18 DEATH — deceased
